# Patient Record
Sex: Male | Born: 1960 | Race: Black or African American | Hispanic: No | Smoking: Never smoker
Health system: Southern US, Community
[De-identification: ages and names within clinical notes are randomized; demographics above are authoritative.]

## PROBLEM LIST (undated history)

## (undated) DIAGNOSIS — F419 Anxiety disorder, unspecified: Secondary | ICD-10-CM

## (undated) DIAGNOSIS — I10 Essential (primary) hypertension: Secondary | ICD-10-CM

## (undated) HISTORY — PX: CHOLECYSTECTOMY: SHX55

## (undated) HISTORY — DX: Anxiety disorder, unspecified: F41.9

---

## 2003-05-17 ENCOUNTER — Ambulatory Visit (HOSPITAL_COMMUNITY): Admission: RE | Admit: 2003-05-17 | Discharge: 2003-05-17 | Payer: Self-pay | Admitting: Gastroenterology

## 2003-05-24 ENCOUNTER — Inpatient Hospital Stay (HOSPITAL_COMMUNITY): Admission: AD | Admit: 2003-05-24 | Discharge: 2003-06-08 | Payer: Self-pay | Admitting: Internal Medicine

## 2003-05-24 ENCOUNTER — Encounter: Admission: RE | Admit: 2003-05-24 | Discharge: 2003-05-24 | Payer: Self-pay | Admitting: Family Medicine

## 2003-05-28 ENCOUNTER — Encounter (INDEPENDENT_AMBULATORY_CARE_PROVIDER_SITE_OTHER): Payer: Self-pay | Admitting: Specialist

## 2009-01-07 ENCOUNTER — Emergency Department (HOSPITAL_COMMUNITY): Admission: EM | Admit: 2009-01-07 | Discharge: 2009-01-08 | Payer: Self-pay | Admitting: Emergency Medicine

## 2009-03-01 ENCOUNTER — Other Ambulatory Visit: Payer: Self-pay | Admitting: Emergency Medicine

## 2009-03-02 ENCOUNTER — Inpatient Hospital Stay (HOSPITAL_COMMUNITY): Admission: EM | Admit: 2009-03-02 | Discharge: 2009-03-06 | Payer: Self-pay | Admitting: Internal Medicine

## 2009-03-02 ENCOUNTER — Other Ambulatory Visit: Payer: Self-pay | Admitting: Emergency Medicine

## 2009-03-05 LAB — CONVERTED CEMR LAB
Calcium: 9.6 mg/dL
Potassium: 4 meq/L

## 2009-03-21 ENCOUNTER — Encounter (INDEPENDENT_AMBULATORY_CARE_PROVIDER_SITE_OTHER): Payer: Self-pay | Admitting: Nurse Practitioner

## 2009-03-21 ENCOUNTER — Ambulatory Visit: Payer: Self-pay | Admitting: Internal Medicine

## 2009-03-21 DIAGNOSIS — F1021 Alcohol dependence, in remission: Secondary | ICD-10-CM

## 2009-03-21 DIAGNOSIS — I10 Essential (primary) hypertension: Secondary | ICD-10-CM

## 2009-03-21 DIAGNOSIS — F411 Generalized anxiety disorder: Secondary | ICD-10-CM | POA: Insufficient documentation

## 2009-05-02 ENCOUNTER — Ambulatory Visit: Payer: Self-pay | Admitting: Nurse Practitioner

## 2009-05-06 ENCOUNTER — Telehealth (INDEPENDENT_AMBULATORY_CARE_PROVIDER_SITE_OTHER): Payer: Self-pay | Admitting: Nurse Practitioner

## 2009-05-08 ENCOUNTER — Telehealth (INDEPENDENT_AMBULATORY_CARE_PROVIDER_SITE_OTHER): Payer: Self-pay | Admitting: Nurse Practitioner

## 2009-05-22 ENCOUNTER — Encounter (INDEPENDENT_AMBULATORY_CARE_PROVIDER_SITE_OTHER): Payer: Self-pay | Admitting: *Deleted

## 2009-07-02 ENCOUNTER — Ambulatory Visit: Payer: Self-pay | Admitting: Nurse Practitioner

## 2009-12-19 ENCOUNTER — Ambulatory Visit: Payer: Self-pay | Admitting: Nurse Practitioner

## 2010-02-01 LAB — CONVERTED CEMR LAB
Hemoglobin: 13 g/dL (ref 13.0–17.0)
MCHC: 33.5 g/dL (ref 30.0–36.0)
MCV: 82.9 fL (ref 78.0–100.0)
Platelets: 200 10*3/uL (ref 150–400)
RBC: 4.68 M/uL (ref 4.22–5.81)
RDW: 12.5 % (ref 11.5–15.5)
Retic Ct Pct: 1.2 % (ref 0.4–3.1)
WBC: 6.3 10*3/uL (ref 4.0–10.5)

## 2010-02-03 NOTE — Assessment & Plan Note (Signed)
Summary: NEW - Establish Care   Vital Signs:  Patient profile:   50 year old male Height:      65 inches Weight:      151.6 pounds BMI:     25.32 BSA:     1.76 Temp:     98.0 degrees F oral Pulse rate:   76 / minute Pulse rhythm:   regular Resp:     20 per minute BP sitting:   141 / 79  (left arm) Cuff size:   large  Vitals Entered By: Levon Hedger (March 21, 2009 9:55 AM) CC: follow-up visit [facility], Hypertension Management Is Patient Diabetic? No Pain Assessment Patient in pain? no       Does patient need assistance? Functional Status Self care Ambulation Normal   CC:  follow-up visit [facility] and Hypertension Management.  History of Present Illness:  Pt is here to establish care with this office Pt was previously seen by Dr. Elmer Picker. Last visit there was 2 months ago.  He was being seen there for htn.  PMH - HTN PSH - cholecystectomy 4 years ago  Pt was admitted to the hospital on 03/02/2009 and stayed for 3.5 days. Pt admitted that he had a history of alcohol use prior to the admission and tried to stop drinking at home. Drinking was interfering with his life and his family was making comments on pt's drinking. DX: ETOH withdrawl, ETOH intoxication, mild hypokalemia, mild hyponatremia. He was started on ativan protocal.  At discharge he was better and nontremulous.   He was set up for [facility] program on March 17th and was seen there however he was not aware that it was a 30 day rehab program.   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   He was not prepared for such committment on yesterday.  He would still like to do a day treatment program of some sort. Pt has been doing well since his hospitalization.  He has not taken any ativan as needed   Anxiety - Long history of anxiety.  Pt has alprazolam that he takes very infrequently (last prescription given in the hospital).  He is taking the celexa as ordered. Pt is tolerating well.    Hypertension History:    He denies headache, chest pain, and palpitations.  He notes no problems with any antihypertensive medication side effects.  Pt does not have a blood pressure machine at home.  .  Further comments include: He is taking medication as per discharge recommendations.        Positive major cardiovascular risk factors include male age 63 years old or older and hypertension.  Negative major cardiovascular risk factors include non-tobacco-user status.     Habits & Providers  Alcohol-Tobacco-Diet     Alcohol drinks/day: 0     Alcohol Counseling: to STOP drinking     Alcohol type: beer     >5/day in last 3 mos: yes     Tobacco Status: never  Exercise-Depression-Behavior     Drug Use: never  Comments: Quit drinking 3 weeks ago ETOH - peaked at 12 beers at night.  Pt then decided to quit and had some withdrawl.  Allergies (verified): No Known Drug Allergies  Past History:  Past Surgical History: Cholecystectomy 2006  Family History: mother - diabetes, breast cancer (deceased) sister - diabetes father - htn  Social History: 0 children No tobacco ETOH - quit 02/2009 Drug use - noneSmoking Status:  never Drug Use:  never  Review of Systems General:  Denies fever. CV:  Denies chest pain or discomfort. Resp:  Denies cough. GI:  Denies abdominal pain, nausea, and vomiting. Neuro:  Denies headaches. Psych:  Denies anxiety and depression.  Physical Exam  General:  alert.   Head:  normocephalic.   Lungs:  normal breath sounds.   Heart:  normal rate and regular rhythm.   Msk:  up to the exam table Neurologic:  alert & oriented X3 and gait normal.   Skin:  color normal.   Psych:  Oriented X3.     Impression & Recommendations:  Problem # 1:  HYPERTENSION, BENIGN ESSENTIAL (ICD-401.1) DASH diet continue current medications His updated medication list for this problem includes:    Metoprolol Tartrate 100 Mg Tabs (Metoprolol tartrate) ..... One tablet by mouth twice daily     Hydrochlorothiazide 25 Mg Tabs (Hydrochlorothiazide) .Marland Kitchen... Take one (1) by mouth daily  Problem # 2:  ANXIETY DISORDER (ICD-300.00) Continue current medications His updated medication list for this problem includes:    Citalopram Hydrobromide 10 Mg Tabs (Citalopram hydrobromide) ..... One tablet by mouth daily    Lorazepam 1 Mg Tabs (Lorazepam) ..... One tablet by mouth daily as needed for anxiety  Problem # 3:  ALCOHOL ABUSE, HX OF (ICD-V11.3) doing great advised pt to see if he can get into a day treatment program.  Complete Medication List: 1)  Folic Acid 1 Mg Tabs (Folic acid) .... One tablet by mouth daily 2)  Vitamin B-1 100 Mg Tabs (Thiamine hcl) .... One tablet by mouth daily 3)  Multivitamins Caps (Multiple vitamin) .... One capsule by mouth daily 4)  Metoprolol Tartrate 100 Mg Tabs (Metoprolol tartrate) .... One tablet by mouth twice daily 5)  Citalopram Hydrobromide 10 Mg Tabs (Citalopram hydrobromide) .... One tablet by mouth daily 6)  Hydrochlorothiazide 25 Mg Tabs (Hydrochlorothiazide) .... Take one (1) by mouth daily 7)  Lorazepam 1 Mg Tabs (Lorazepam) .... One tablet by mouth daily as needed for anxiety  Hypertension Assessment/Plan:      The patient's hypertensive risk group is category B: At least one risk factor (excluding diabetes) with no target organ damage.  Today's blood pressure is 141/79.  His blood pressure goal is < 140/90.  Patient Instructions: 1)  Check with [facility] and see if you can get into a day treatment program. 2)  Blood pressure - check twice per week at home.  Read handout.  Decrease salty and processed foods. 3)  Continue current medications 4)  Keep your appointment for eligibility on April 27th. 5)  Follow up in this office in 6 weeks for high blood pressure. 6)  Will need EKG, anemia panel.

## 2010-02-03 NOTE — Letter (Signed)
Summary: Generic Letter  [facility]  [address] [place], [place] [address]   Phone: [phone]  Fax: [phone]    05/22/2009  Miguel Washington [address] [place], [place]  [address]  Dear [name],  We have been unable to reach you by phone. We have a prescriptoin for you. Please call our office to let us know where to send it.         Sincerely,   Gaylyn Cheers RN  Appended Document: Celexa refill Pt. returned call notified of refill for Celexa, refilled called to [facility] Orlando Penner RN  June 05, 2009 4:31 PM    Clinical Lists Changes

## 2010-02-03 NOTE — Progress Notes (Signed)
Summary: Lab results  Phone Note Outgoing Call   Summary of Call: see if pt has remained alcholol free? if so, congrats to pt. If so, ok to stop folic acid and vitamin b 12 he will need to continue just the multivitamin daily as he is still just a little anemic but improved from hospitalization Initial call taken by: Lehman Prom FNP,  May 06, 2009 4:09 PM  Follow-up for Phone Call        Levon Hedger  May 06, 2009 4:59 PM Left message on machine for pt to return call to the office.  pt informed of above information. Follow-up by: Levon Hedger,  May 08, 2009 4:19 PM

## 2010-02-03 NOTE — Assessment & Plan Note (Signed)
Summary: HTN   Vital Signs:  Patient profile:   50 year old male Weight:      159.2 pounds BMI:     26.59 Temp:     98.0 degrees F oral Pulse rate:   71 / minute Pulse rhythm:   regular Resp:     16 per minute BP sitting:   174 / 100  (left arm) Cuff size:   regular  Vitals Entered ByLevon Hedger (July 02, 2009 2:19 PM)  Serial Vital Signs/Assessments:  Time      Position  BP       Pulse  Resp  Temp     By                     159/90                         Levon Hedger  CC: follow-up visit 2 month, Hypertension Management Is Patient Diabetic? No Pain Assessment Patient in pain? no       Does patient need assistance? Functional Status Self care Ambulation Normal Comments did not bring Lorazepam medication bottle but states that he is taking it.   CC:  follow-up visit 2 month and Hypertension Management.  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  Temptation is strong.  Hypertension History:      He denies headache, chest pain, and palpitations.  He notes no problems with any antihypertensive medication side effects.  Pt has taken blood pressure medications already today. He also checks at home and it is doing well.        Positive major cardiovascular risk factors include male age 76 years old or older and hypertension.  Negative major cardiovascular risk factors include non-tobacco-user status.     Habits & Providers  Alcohol-Tobacco-Diet     Alcohol drinks/day: 1     Alcohol Counseling: to STOP drinking     Alcohol type: beer     >5/day in last 3 mos: yes     Tobacco Status: never  Exercise-Depression-Behavior     Drug Use: never  Comments: recently restarted ETOH  use  Medications Prior to Update: 1)  Multivitamins  Caps (Multiple Vitamin) .... One Capsule By Mouth Daily 2)  Metoprolol Tartrate 100 Mg Tabs (Metoprolol Tartrate) .... One Tablet By Mouth Twice Daily 3)  Celexa 10 Mg Tabs (Citalopram Hydrobromide) .... One Tablet By Mouth Daily For Mood 4)  Hydrochlorothiazide 25 Mg Tabs (Hydrochlorothiazide) .... Take One (1) By Mouth Daily 5)  Lorazepam 1 Mg Tabs (Lorazepam) .... One Tablet By Mouth Daily As Needed For Anxiety  Allergies (verified): No Known Drug Allergies  Review of Systems General:  Denies fever. CV:  Denies chest pain or discomfort. Resp:  Denies cough. GI:  Denies abdominal pain, nausea, and vomiting. Psych:  Complains of anxiety; improved some with celexa 10mg .  Physical Exam  General:  alert.   Head:  normocephalic.   Lungs:  normal breath sounds.   Heart:  normal rate and regular rhythm.   Msk:  up to the exam table Neurologic:  alert & oriented X3.  Skin:  color normal.   Psych:  anxious appearing   Impression & Recommendations:  Problem # 1:  HYPERTENSION, BENIGN ESSENTIAL (ICD-401.1) BP is elevated today but ok during last check will continue current meds reinforced DASH diet His updated medication list for this problem includes:    Metoprolol Tartrate 100 Mg Tabs (Metoprolol tartrate) ..... One tablet by mouth twice daily    Hydrochlorothiazide 25 Mg Tabs (Hydrochlorothiazide) .Marland Kitchen... Take one (1) by mouth daily  Problem # 2:  ANXIETY DISORDER (ICD-300.00) advised pt that he is taking a low dose of celexa - will increse if necessary The following medications were removed from the medication list:    Lorazepam 1 Mg Tabs (Lorazepam) ..... One tablet by mouth daily as needed for anxiety His updated medication list for this problem includes:    Celexa 10 Mg Tabs (Citalopram hydrobromide) ..... One tablet by mouth daily for mood  Complete Medication List: 1)  Multivitamins Caps (Multiple vitamin) .... One  capsule by mouth daily 2)  Metoprolol Tartrate 100 Mg Tabs (Metoprolol tartrate) .... One tablet by mouth twice daily 3)  Celexa 10 Mg Tabs (Citalopram hydrobromide) .... One tablet by mouth daily for mood 4)  Hydrochlorothiazide 25 Mg Tabs (Hydrochlorothiazide) .... Take one (1) by mouth daily  Hypertension Assessment/Plan:      The patient's hypertensive risk group is category B: At least one risk factor (excluding diabetes) with no target organ damage.  Today's blood pressure is 174/100.  His blood pressure goal is < 140/90.  Patient Instructions: 1)  Schedule an appointment for eligibility if your card has expired.  2)  Be sure to  take all the necessary paperwork with you. 3)  Do your best to limit you alcohol intake 4)  Blood pressure is elevated today. Be sure to take your medications daily. 5)  Follow up in 3 months for blood pressure

## 2010-02-03 NOTE — Progress Notes (Signed)
Summary: ? ABOUT VITAMINS/DEPRESSION MED  Phone Note Call from Patient Call back at Home Phone [phone]   Reason for Call: Talk to Nurse Summary of Call: Miguel PT. MR Washington CALLED BACK AND SAYS THAT SINCE HE IS TO KEEP TAKING THE VITAMINS, HE DOES NOT HAVE ANYMORE, AND THEY WERE PRESCRIBED BY THE HOSPITAL AND HE WAS TAKING CITALORPRAM FOR DEPRESSION , IT WAS HELPING HIM, BUT HE WANTS TO KNOW DOES HE CONTINU TAKING THESE MEDS. Initial call taken by: Leodis Rains,  May 08, 2009 4:50 PM  Follow-up for Phone Call        FORWARD TO Jesse Fall, FNP Follow-up by: Levon Hedger,  May 09, 2009 9:51 AM  Additional Follow-up for Phone Call Additional follow up Details #1::        pt was instructed to stop the folic acid and vitamin B so he does not need these.  He can continue the multivitamin which can be purchased over the counter.  Rx for celexa 10mg  by mouth in basket - fax to pt's pharmacy Additional Follow-up by: Lehman Prom FNP,  May 09, 2009 10:35 AM    Additional Follow-up for Phone Call Additional follow up Details #2::    Levon Hedger  May 12, 2009 5:13 PM left message on machine for pt to return call to the office.  Levon Hedger  May 16, 2009 5:04 PM Left message on the machine for pt to return call to the office.  left message on answer machine for pt. to return call Gaylyn Cheers RN  May 22, 2009 3:06 PM   ok to mail pt a letter since it has been 3 attempts n.[email or web address],[email or web address]  May 22, 2009 5:20 PM   Additional Follow-up for Phone Call Additional follow up Details #3:: Details for Additional Follow-up Action Taken: Letter mailed Gaylyn Cheers RN  May 22, 2009 5:41 PM   New/Updated Medications: CELEXA 10 MG TABS (CITALOPRAM HYDROBROMIDE) One tablet by mouth daily for mood zPrescriptions: CELEXA 10 MG TABS (CITALOPRAM HYDROBROMIDE) One tablet by mouth daily for mood  #30 x 5   Entered and Authorized by:   Lehman Prom FNP   Signed by:   Lehman Prom FNP on 05/09/2009   Method used:   Printed then faxed to ...         RxID:   [id number]

## 2010-02-03 NOTE — Letter (Signed)
Summary: PT INFORMATION SHEET  PT INFORMATION SHEET   Imported By: Arta Bruce 05/20/2009 11:10:42  _____________________________________________________________________  External Attachment:    Type:   Image     Comment:   External Document

## 2010-02-26 ENCOUNTER — Telehealth (INDEPENDENT_AMBULATORY_CARE_PROVIDER_SITE_OTHER): Payer: Self-pay | Admitting: Nurse Practitioner

## 2010-03-12 NOTE — Progress Notes (Signed)
Summary: BP MEDS NEEDS REFILL  Phone Note Call from Patient Call back at Home Phone [phone]   Reason for Call: Refill Medication Summary of Call: MARTIN PT. MR Vellucci SAYS THAT HE IS NEEDING REFILLS ON HIS HCTZ AND THE METOPROLOL TO BE CALLED INTO [facility] ON [address]. Initial call taken by: Leodis Rains,  February 26, 2010 4:31 PM  Follow-up for Phone Call        Refills completed per protocol per Jesse Fall -- last seen 08/2009.  Must see provider before further refills.  Left message on voicemail for pt. to return call.  Dutch Quint RN  February 27, 2010 5:08 PM  Left message on answering machine for pt. to return call.  Dutch Quint RN  March 02, 2010 3:48 PM  Left message on answer machine for pt. to return call. Gaylyn Cheers RN  March 03, 2010 12:33 PM  Please call pt.  Sharen Heck RN,  March 04, 2010 8:36 AM  Left message on answering machine for pt. to return call.  Dutch Quint RN  March 05, 2010 9:10 AM  Left message on answering machine for pt. to return call.  Dutch Quint RN  March 05, 2010 3:56 PM  Pt. advised of refills -- has f/u appt. 03/10/10.  Dutch Quint RN  March 05, 2010 4:17 PM     Prescriptions: HYDROCHLOROTHIAZIDE 25 MG TABS (HYDROCHLOROTHIAZIDE) Take one (1) by mouth daily  #30 x 3   Entered by:   Dutch Quint RN   Authorized by:   Lehman Prom FNP   Signed by:   Dutch Quint RN on 02/27/2010   Method used:   Electronically to        [facility]       [address]       [place], [place]  [address]       Ph: [phone]       Fax: [phone]   RxID:   [id number] METOPROLOL TARTRATE 100 MG TABS (METOPROLOL TARTRATE) One tablet by mouth twice daily  #60 x 3   Entered by:   Dutch Quint RN   Authorized by:   Lehman Prom FNP   Signed by:   Dutch Quint RN on 02/27/2010   Method used:   Electronically to        [facility]       [address]       [place], [place]  [address]       Ph: [phone]       Fax: [phone]   RxID:   [id number]

## 2010-03-19 ENCOUNTER — Encounter: Payer: Self-pay | Admitting: Nurse Practitioner

## 2010-03-19 ENCOUNTER — Encounter (INDEPENDENT_AMBULATORY_CARE_PROVIDER_SITE_OTHER): Payer: Self-pay | Admitting: Nurse Practitioner

## 2010-03-19 LAB — CONVERTED CEMR LAB
Bilirubin Urine: NEGATIVE
Blood in Urine, dipstick: NEGATIVE
Specific Gravity, Urine: 1.015

## 2010-03-20 LAB — CONVERTED CEMR LAB
ALT: 38 units/L (ref 0–53)
AST: 30 units/L (ref 0–37)
Alkaline Phosphatase: 59 units/L (ref 39–117)
BUN: 8 mg/dL (ref 6–23)
Basophils Absolute: 0.1 10*3/uL (ref 0.0–0.1)
CO2: 33 meq/L — ABNORMAL HIGH (ref 19–32)
Calcium: 10.1 mg/dL (ref 8.4–10.5)
Eosinophils Absolute: 0.1 10*3/uL (ref 0.0–0.7)
Glucose, Bld: 97 mg/dL (ref 70–99)
HCT: 37.8 % — ABNORMAL LOW (ref 39.0–52.0)
Lymphocytes Relative: 22 % (ref 12–46)
Lymphs Abs: 1.4 10*3/uL (ref 0.7–4.0)
MCV: 87.5 fL (ref 78.0–100.0)
Monocytes Relative: 26 % — ABNORMAL HIGH (ref 3–12)
PSA: 0.37 ng/mL (ref <=4.00)
Platelets: 215 10*3/uL (ref 150–400)
RBC: 4.32 M/uL (ref 4.22–5.81)
Total Protein: 7.5 g/dL (ref 6.0–8.3)

## 2010-03-24 NOTE — Assessment & Plan Note (Signed)
Summary: HTN   Vital Signs:  Patient profile:   50 year old male Weight:      162.19 pounds Temp:     98.2 degrees F oral Pulse rate:   67 / minute Pulse rhythm:   regular Resp:     18 per minute BP sitting:   150 / 82  (left arm) Cuff size:   regular  Vitals Entered By: Hale Drone CMA (March 19, 2010 3:20 PM) CC: BP check , Hypertension Management Is Patient Diabetic? No Pain Assessment Patient in pain? no       Does patient need assistance? Functional Status Self care Ambulation Normal   CC:  BP check  and Hypertension Management.  History of Present Illness:  Pt into the office for follow up. Pt presents today with his medications.   Hypertension History:      He denies headache, chest pain, and palpitations.        Positive major cardiovascular risk factors include male age 100 years old or older and hypertension.  Negative major cardiovascular risk factors include no history of diabetes or hyperlipidemia and non-tobacco-user status.        Further assessment for target organ damage reveals no history of ASHD, cardiac end-organ damage (CHF/LVH), stroke/TIA, peripheral vascular disease, renal insufficiency, or hypertensive retinopathy.     Habits & Providers  Alcohol-Tobacco-Diet     Alcohol drinks/day: 1     Alcohol Counseling: to STOP drinking     Alcohol type: beer     >5/day in last 3 mos: yes     Tobacco Status: never  Exercise-Depression-Behavior     Have you felt down or hopeless? no     Have you felt little pleasure in things? no     Depression Counseling: not indicated; screening negative for depression     Drug Use: never  Current Medications (verified): 1)  Multivitamins  Caps (Multiple Vitamin) .... One Capsule By Mouth Daily 2)  Metoprolol Tartrate 100 Mg Tabs (Metoprolol Tartrate) .... One Tablet By Mouth Twice Daily 3)  Celexa 10 Mg Tabs (Citalopram Hydrobromide) .... One Tablet By Mouth Daily For Mood 4)  Hydrochlorothiazide 25 Mg Tabs  (Hydrochlorothiazide) .... Take One (1) By Mouth Daily  Allergies (verified): No Known Drug Allergies  Review of Systems General:  Denies fever. CV:  Denies chest pain or discomfort. Resp:  Denies cough. GI:  Denies abdominal pain, nausea, and vomiting. MS:  Denies joint pain. Psych:  Complains of anxiety; pt has recently stopped drinking again - 1 week stopped and he has lorazepam that expired on 02/27/2010. He is requesting a refill.  Physical Exam  General:  alert.   Head:  normocephalic.   Lungs:  normal breath sounds.   Heart:  normal rate and regular rhythm.   Abdomen:  normal bowel sounds.   Msk:  normal ROM.   Neurologic:  alert & oriented X3.   Skin:  color normal.   Psych:  Oriented X3.     Impression & Recommendations:  Problem # 1:  HYPERTENSION, BENIGN ESSENTIAL (ICD-401.1)  His updated medication list for this problem includes:    Metoprolol Tartrate 100 Mg Tabs (Metoprolol tartrate) ..... One tablet by mouth twice daily    Hydrochlorothiazide 25 Mg Tabs (Hydrochlorothiazide) .Marland Kitchen... Take one (1) by mouth daily  Orders: EKG w/ Interpretation (93000) T-Urine Microalbumin w/creat. ratio [id number]) T-Comprehensive Metabolic Panel [id number]) T-PSA [id number]) T-CBC w/Diff [id number]) Rapid HIV  (92370) T-Syphilis Test (RPR) (32440-10272) T-TSH (53664-40347)  Problem #  2:  ANXIETY DISORDER (ICD-300.00)  The following medications were removed from the medication list:    Celexa 10 Mg Tabs (Citalopram hydrobromide) ..... One tablet by mouth daily for mood His updated medication list for this problem includes:    Lorazepam 0.5 Mg Tabs (Lorazepam) ..... One tablet by mouth daily as needed for nerves  Problem # 3:  ALCOHOL ABUSE, HX OF (ICD-V11.3) pt has quit x 1 week ago.  Complete Medication List: 1)  Multivitamins Caps (Multiple vitamin) .... One capsule by mouth daily 2)  Metoprolol Tartrate 100 Mg Tabs (Metoprolol tartrate) .... One tablet  by mouth twice daily 3)  Hydrochlorothiazide 25 Mg Tabs (Hydrochlorothiazide) .... Take one (1) by mouth daily 4)  Lorazepam 0.5 Mg Tabs (Lorazepam) .... One tablet by mouth daily as needed for nerves  Other Orders: UA Dipstick w/o Micro (automated)  (81003)  Hypertension Assessment/Plan:      The patient's hypertensive risk group is category B: At least one risk factor (excluding diabetes) with no target organ damage.  Today's blood pressure is 150/82.  His blood pressure goal is < 140/90.  Patient Instructions: 1)  Blood pressure - Keep taking your blood pressure medication. 2)  Get the alprazolam refilled because the meds your have are expired 3)  Make an appointment for fasting labs - lipids. 4)  All other labs will be checked today and you will be notified of the results. 5)  Follow up in 6 months for high blood pressure. Prescriptions: LORAZEPAM 0.5 MG TABS (LORAZEPAM) One tablet by mouth daily as needed for nerves  #30 x 0   Entered and Authorized by:   Lehman Prom FNP   Signed by:   Lehman Prom FNP on 03/19/2010   Method used:   Print then Give to Patient   RxID:   [id number]    Orders Added: 1)  UA Dipstick w/o Micro (automated)  [81003] 2)  Est. Patient Level III [95621] 3)  EKG w/ Interpretation [93000] 4)  T-Urine Microalbumin w/creat. ratio [[id number]] 5)  T-Comprehensive Metabolic Panel [80053-22900] 6)  T-PSA [30865-78469] 7)  T-CBC w/Diff [62952-84132] 8)  Rapid HIV  [92370] 9)  T-Syphilis Test (RPR) [44010-27253] 10)  T-TSH [66440-34742]    Laboratory Results   Urine Tests  Date/Time Received: March 19, 2010 3:25 PM   Routine Urinalysis   Color: lt. yellow Glucose: negative   (Normal Range: Negative) Bilirubin: negative   (Normal Range: Negative) Ketone: negative   (Normal Range: Negative) Spec. Gravity: 1.015   (Normal Range: 1.003-1.035) Blood: negative   (Normal Range: Negative) pH: 7.5   (Normal Range: 5.0-8.0) Protein:  negative   (Normal Range: Negative) Urobilinogen: 1.0   (Normal Range: 0-1) Nitrite: negative   (Normal Range: Negative) Leukocyte Esterace: negative   (Normal Range: Negative)        EKG  Procedure date:  03/19/2010  Findings:      Sinus rhythm   Appended Document: HTN     Allergies: No Known Drug Allergies   Complete Medication List: 1)  Multivitamins Caps (Multiple vitamin) .... One capsule by mouth daily 2)  Metoprolol Tartrate 100 Mg Tabs (Metoprolol tartrate) .... One tablet by mouth twice daily 3)  Hydrochlorothiazide 25 Mg Tabs (Hydrochlorothiazide) .... Take one (1) by mouth daily 4)  Lorazepam 0.5 Mg Tabs (Lorazepam) .... One tablet by mouth daily as needed for nerves      Laboratory Results  Date/Time Received: March 19, 2010 5:14 PM   Other Tests  Rapid HIV:  negative

## 2010-03-25 LAB — CBC
HCT: 42.8 % (ref 39.0–52.0)
Hemoglobin: 14.5 g/dL (ref 13.0–17.0)
MCV: 90.6 fL (ref 78.0–100.0)
RDW: 13.2 % (ref 11.5–15.5)

## 2010-03-25 LAB — DIFFERENTIAL
Basophils Absolute: 0 10*3/uL (ref 0.0–0.1)
Eosinophils Absolute: 0 10*3/uL (ref 0.0–0.7)
Lymphocytes Relative: 15 % (ref 12–46)
Lymphs Abs: 1.1 10*3/uL (ref 0.7–4.0)
Monocytes Absolute: 1.2 10*3/uL — ABNORMAL HIGH (ref 0.1–1.0)
Monocytes Relative: 17 % — ABNORMAL HIGH (ref 3–12)

## 2010-03-25 LAB — COMPREHENSIVE METABOLIC PANEL
ALT: 55 U/L — ABNORMAL HIGH (ref 0–53)
Albumin: 4.5 g/dL (ref 3.5–5.2)
Alkaline Phosphatase: 80 U/L (ref 39–117)
CO2: 23 mEq/L (ref 19–32)
Chloride: 82 mEq/L — ABNORMAL LOW (ref 96–112)
Potassium: 3.3 mEq/L — ABNORMAL LOW (ref 3.5–5.1)
Sodium: 130 mEq/L — ABNORMAL LOW (ref 135–145)
Total Protein: 9.3 g/dL — ABNORMAL HIGH (ref 6.0–8.3)

## 2010-03-25 LAB — URINALYSIS, ROUTINE W REFLEX MICROSCOPIC
Protein, ur: NEGATIVE mg/dL
Urobilinogen, UA: 1 mg/dL (ref 0.0–1.0)
pH: 6 (ref 5.0–8.0)

## 2010-03-25 LAB — LIPASE, BLOOD: Lipase: 37 U/L (ref 11–59)

## 2010-03-26 LAB — BASIC METABOLIC PANEL
BUN: 14 mg/dL (ref 6–23)
BUN: 6 mg/dL (ref 6–23)
BUN: 9 mg/dL (ref 6–23)
Chloride: 92 mEq/L — ABNORMAL LOW (ref 96–112)
GFR calc Af Amer: >60 mL/min (ref >60)
GFR calc non Af Amer: >60 mL/min (ref >60)
Glucose, Bld: 109 mg/dL — ABNORMAL HIGH (ref 70–99)
Potassium: 2.5 mEq/L — CL (ref 3.5–5.1)
Potassium: 3.1 mEq/L — ABNORMAL LOW (ref 3.5–5.1)
Potassium: 3.3 mEq/L — ABNORMAL LOW (ref 3.5–5.1)
Sodium: 132 mEq/L — ABNORMAL LOW (ref 135–145)
Sodium: 133 mEq/L — ABNORMAL LOW (ref 135–145)
Sodium: 135 mEq/L (ref 135–145)

## 2010-03-26 LAB — CBC
MCHC: 34.5 g/dL (ref 30.0–36.0)
MCV: 89.2 fL (ref 78.0–100.0)
RBC: 4.01 MIL/uL — ABNORMAL LOW (ref 4.22–5.81)
RDW: 13.5 % (ref 11.5–15.5)
WBC: 6.3 10*3/uL (ref 4.0–10.5)

## 2010-03-26 LAB — MAGNESIUM: Magnesium: 1.8 mg/dL (ref 1.5–2.5)

## 2010-03-30 LAB — BASIC METABOLIC PANEL
BUN: 7 mg/dL (ref 6–23)
CO2: 29 mEq/L (ref 19–32)
CO2: 31 mEq/L (ref 19–32)
Calcium: 9.4 mg/dL (ref 8.4–10.5)
Calcium: 9.6 mg/dL (ref 8.4–10.5)
Chloride: 100 mEq/L (ref 96–112)
Creatinine, Ser: 0.9 mg/dL (ref 0.4–1.5)
Creatinine, Ser: 0.91 mg/dL (ref 0.4–1.5)
Creatinine, Ser: 0.99 mg/dL (ref 0.4–1.5)
GFR calc Af Amer: >60 mL/min (ref >60)
GFR calc Af Amer: >60 mL/min (ref >60)
GFR calc non Af Amer: >60 mL/min (ref >60)
GFR calc non Af Amer: >60 mL/min (ref >60)
Glucose, Bld: 100 mg/dL — ABNORMAL HIGH (ref 70–99)
Glucose, Bld: 92 mg/dL (ref 70–99)
Potassium: 3.8 mEq/L (ref 3.5–5.1)
Sodium: 136 mEq/L (ref 135–145)

## 2010-03-30 LAB — PHOSPHORUS: Phosphorus: 4.7 mg/dL — ABNORMAL HIGH (ref 2.3–4.6)

## 2010-05-22 NOTE — H&P (Signed)
NAMEJERSON, FURUKAWA                     ACCOUNT NO.:  [id number]   MEDICAL RECORD NO.:  [id number]                   PATIENT TYPE:  INP   LOCATION:  5030                                 FACILITY:  [facility]   PHYSICIAN:  Hollice Espy, M.D.            DATE OF BIRTH:  06-06-1960   DATE OF ADMISSION:  05/24/2003  DATE OF DISCHARGE:                                HISTORY & PHYSICAL   CHIEF COMPLAINT:  Liver mass.   HISTORY OF PRESENT ILLNESS:  This is a 50 year old African-American male  with past medical history of hypertension and heavy alcohol abuse who  presents with the findings of a liver mass. The patient previously has been  drinking for approximately 20 years, at least 15 to 20 beers a day. He  otherwise, had no medical complaints and was taking his hypertensive  medications regularly. Approximately 3 weeks ago, he started having some  abdominal discomfort and severe nausea and vomiting. Prior to that, he noted  no significant weight loss but then found that he was unable to keep  anything down. He was tried on a course of Aciphex, which did not seem to  make any difference. He had an endoscopy and colonoscopy done on May 17, 2003 by Dr. Tasia Catchings of [facility]. At that time, his EGD only noted a  mild distal non-hemorrhagic esophagitis. His colonoscopy showed no tumors,  polyps, or neoplastic lesions. No evidence of bleeding. The patient  continued to have increasing abdominal pain and outpatient lab work showed  an elevated bilirubin. His problems persisted and labs were rechecked. At  that time, his liver function studies were reportedly normal. Labs were  rechecked again today and the patient was found to have significantly  elevated liver function studies along with increased total bilirubin of 14.  The patient was sent in for an ultrasound of the abdomen, which was done on  May 24, 2003, which noted a 14.8 x 7.3 x 10.2 cm irregular inhomogeneous  mass in the  central portion of the liver, obstructing the bile ducts in the  right and left lobes. The gallbladder was noted to be contracting with a  thickened mechanic wall. The common bile duct was not visible. Kidneys were  unremarkable and some multiple hyperechoic foci were seen within the spleen.  A tiny amount of ascites were noted as well. This was suspicious for primary  hepatocellular carcinoma. Given these findings and the patient's lab  studies, the patient was made a direct admission for further evaluation.  Currently, the patient states that he is feeling okay. He is a little bit  anxious about the possibility that he may have cancer but otherwise, he  denies any complaints and currently is not having any nausea although he  does feel weak from not eating. He denies any chest pain or shortness of  breath. He denies any palpitation. He denies any headache, visual changes or  dysphagia. He denies  any current abdominal pain. He denies any hematuria or  dysuria. He has not had a bowel movement for quite some time but he does not  think it is from constipation. He thinks it is from not being able to move  his bowels. He has no problem with flatus. He denies any diarrhea. He denies  any focal extremity weakness or numbness. He does state that overall, he  does feel fatigued. He does tell me that his last drink was 3 weeks ago when  he was last able to take decreased p.o. and he has not had an alcoholic  beverage since then.   PAST MEDICAL HISTORY:  Hypertension times at least 20 years. He also is  positive for heavy alcohol abuse.   MEDICATIONS:  The patient takes Norvasc 5 daily, Metoprolol 100 mg in the  a.m. and 50 mg at night. Hydrochlorothiazide 12.5 mg p.o. daily. And he also  takes a multivitamin. He has recently been on a course of Aciphex 1 p.o.  daily for approximately a week.   ALLERGIES:  No known drug allergies.   SOCIAL HISTORY:  He denies any tobacco or drug use. He is,  however, a heavy  beer drinker of 15 to 20 beers daily for the last 20 years.   FAMILY HISTORY:  Positive for heart disease, different types of cancer,  blood pressure and diabetes.   PHYSICAL EXAMINATION:  VITAL SIGNS:  Temperature 100.3, blood pressure  115/80, respiratory rate 20, pulse 91, O2 saturation 100% on room air.  GENERAL:  Alert and oriented x3. No apparent distress although slightly  anxious obviously given the circumstances.  HEENT:  Normocephalic, atraumatic. Sclera are icteric. He has dry mucous  membranes. He has no carotid bruits.  HEART:  Regular rate and rhythm. There is a questionable systolic ejection  murmur, 2 out of 6 but again, it is very mild.  LUNGS:  Clear to auscultation bilaterally.  ABDOMEN:  Soft, slightly distended but nontender. He has hypoactive bowel  sounds. He has approximately what appears to feel like a 2 cm liver edge  below the costal margin.  EXTREMITIES:  He has no clubbing, cyanosis, or edema. He has good 2+ pulses.   LABORATORY DATA:  Drawn and pending. This includes a coagulation, CBC, and C-  met as well amylase and lipase.   ASSESSMENT/PLAN:  1. Liver mass. This is suspicious for primary hepatocellular carcinoma,     especially given the patient's history of heavy drinking. Will await the     labs as follows. I spoke with Dr. Ewing Schlein, who is on for [facility] tonight.     Will check a CT of the abdomen and pelvis with contrast to evaluate     whether or not the suspected mass is impinging on his biliary ductal     system and he will need an ERCP or not. If not, we may be able to proceed     straight to a biopsy. In the meantime, will avoid Tylenol and treat his     with supplements. Will also give him IV fluids with Dextrose for     supportive measures.  2. History of heavy alcohol abuse. His last drink was 3 weeks ago. At this     time, we will just watch him for withdrawal signs but I believe that we    are past the 72 hour mark  period.  3. Hypertension. Continue Norvasc, Metoprolol, and hydrochlorothiazide.  Hollice Espy, M.D.    SKK/MEDQ  D:  05/24/2003  T:  05/25/2003  Job:  [id number]   cc:   Donia Guiles, M.D.  [address] [place]  [place] [address]  Fax: [phone]   Petra Kuba, M.D.  [address]  [place]  [place] [address]  Fax: [phone]   Tasia Catchings, M.D.  [address]  [place]  [place] [address]  Fax: [phone]

## 2010-05-22 NOTE — Discharge Summary (Signed)
NAMEKHOI, HAMBERGER                     ACCOUNT NO.:  [id number]   MEDICAL RECORD NO.:  [id number]                   PATIENT TYPE:  INP   LOCATION:  5013                                 FACILITY:  [facility]   PHYSICIAN:  Corinna L. Lendell Caprice, MD             DATE OF BIRTH:  28-Mar-1960   DATE OF ADMISSION:  05/24/2003  DATE OF DISCHARGE:  06/08/2003                                 DISCHARGE SUMMARY   DIAGNOSES:  1. Pyogenic liver abscess.  2. History of alcohol abuse.  3. Hypertension.   DISCHARGE MEDICATION:  Augmentin 875 mg p.o. b.i.d. until gone.   Drain care three to four times a day and keep dry.  Limit lifting.  Advil as  needed for pain.  No restrictions on diet.   CONDITION ON DISCHARGE:  Stable.   FOLLOW UP:  Dr. Magnus Ivan next week for drain removal.   PROCEDURES:  1. Status post percutaneous drain placement in liver abscess.  2. Status post exploratory laparotomy, cholecystectomy and abscess drainage.   CONSULTATION:  Infectious disease, Dr. Magnus Ivan.   HISTORY AND HOSPITAL COURSE:  Mr. Formby is a 50 year old white male who  presented with liver mass.  He was found to have a CT of the abdomen and  pelvis which showed mass.  He had had abdominal discomfort and nausea and  vomiting three weeks prior to admission.  He had an EGD which showed  esophagitis and a colonoscopy which was unremarkable.  He had also had an  ultrasound of the abdomen which showed a mass in the liver and was therefore  admitted to the hospital.  He had a temperature of 100.3 and otherwise  normal vital signs.  He had a slightly distended abdomen which was soft and  enlarged liver.  CAT scan was done which was more suggestive for abscess  probably from gallstones.  Infectious disease was consulted.  Antibiotics  were started empirically with Zosyn and interventional radiology placed a  percutaneous drain.  Serial CAT scans were done and surgery was consulted.  The ultrasound apparently had  also shown common bile duct obstruction.  The  patient eventually underwent exploratory laparotomy, cholecystectomy,  cholangiogram and drainage of the liver abscess as the abscess  continued to be quite large despite drain placement.  Eventually, the  patient was switched to p.o. antibiotics, diet was started and he was sent  home with a drain.   Please see H&P for further admission details.                                                Corinna L. Lendell Caprice, MD    CLS/MEDQ  D:  08/07/2003  T:  08/08/2003  Job:  [id number]   cc:   Donia Guiles, M.D.  [address] [place]  [place] [address]  Fax: [phone]   Petra Kuba, M.D.  [address]  [place]  [place] [address]  Fax: [phone]   Tasia Catchings, M.D.  [address]  [place]  [place] [address]  Fax: [phone]

## 2010-05-22 NOTE — Consult Note (Signed)
NAMEHAILE, BOSLER                     ACCOUNT NO.:  [id number]   MEDICAL RECORD NO.:  [id number]                   PATIENT TYPE:  INP   LOCATION:  5030                                 FACILITY:  [facility]   PHYSICIAN:  Abigail Miyamoto, M.D.              DATE OF BIRTH:  02-Aug-1960   DATE OF CONSULTATION:  05/27/2003  DATE OF DISCHARGE:                                   CONSULTATION   REFERRING PHYSICIAN:  Hollice Espy, M.D.   CHIEF COMPLAINT:  Cholecystitis and liver abscess.   HISTORY OF PRESENT ILLNESS:  Mr. Kehoe is a 50 year old African American  gentleman with a medical history significant for hypertension and heavy  alcohol abuse, who presented with several week history of nausea, vomiting,  right upper quadrant abdominal pain.  He was admitted by Dr. Rito Ehrlich and  had a thorough medical work-up. He has recently had an upper and lower  endoscopy which was fairly unremarkable except for some mild nonhemorrhagic  esophagitis.  He had persistent elevated bilirubin and has had an ultrasound  now that shows him to have a 14 x 10 cm regular inhomogeneous mass at the  central portion of his liver with obstructed bile ducts.  Since this  admission, he has also had a CAT scan confirming this and now has had  percutaneous drainage of this liver abscess.  Mr. Petra Kuba has seen Mr.  Heacock from a gastroenterology standpoint as well.  He suspected this  abscess was due to cholecystitis and symptomatic cholelithiasis.  Since  percutaneous drainage, the patient has remained febrile but has improved  symptomatically.  He reports he still has some mild right upper quadrant  pain but is able to tolerate a diet now.  I have been consulted on Mr.  Miles concerning cholecystectomy and further care of the liver abscess.  Currently is without further complaints and denies chest pain or shortness  of breath.   PAST MEDICAL HISTORY:  1. Hypertension.  2. He also has a history  of heavy alcohol abuse.   MEDICATIONS:  1. Norvasc.  2. Metoprolol.  3. Hydrochlorothiazide.  4. Multivitamins.  5. Aciphex.   ALLERGIES:  No known drug allergies.   PAST SURGICAL HISTORY:  No previous surgical history.   SOCIAL HISTORY:  He denied alcohol or drug abuse, however, he does drink 15  to 20 beers a day for the last 20 years but has recently quit.   FAMILY HISTORY:  Positive for heart disease and different types of cancer,  blood pressure, diabetes problems.   REVIEW OF SYMPTOMS:  Again, is unremarkable.  There is no dysuria.   PHYSICAL EXAMINATION:  GENERAL APPEARANCE:  A thin gentleman lying in bed in  no acute distress. He is well in appearance.  VITAL SIGNS:  Temperature 101.2, heart rate 102, respiratory rate 18, blood  pressure 137/74.  HEENT:  He has positive scleral icterus.  Pupils reactive bilaterally.  Ears, nose,  mouth and throat:  External ears and nose are normal.  Hearing  is normal.  Oropharynx is clear.  NECK:  Supple.  There is no cervical adenopathy.  There is no thyromegaly.  LUNGS:  Clear to auscultation bilaterally with normal respiratory effort.  CARDIOVASCULAR:  Regular rate and rhythm.  There is a 2/6 systolic ejection  murmur.  ABDOMEN:  Soft.  There is tenderness with some mild guarding in the right  upper quadrant.  There is a drainage catheter in the right upper quadrant  draining serobilious fluid.  There are no hernias, there is no further  organomegaly.  EXTREMITIES:  Warm and well perfused.  There is no clubbing, cyanosis, or  edema.  NEUROLOGIC:  He is grossly intact.   LABORATORY DATA:  The patient currently has a white blood cell count of  22.8, hemoglobin 8.8. Bilirubin is 4.6, alkaline phosphatase is 207.   IMPRESSION AND PLAN:  This is a 50 year old gentleman with cholecystitis and  probable secondary to large liver abscess which has been causing compressive  symptoms on the bile duct.  He has had improvement of symptoms  with  percutaneous drainage.  As this point, he is going back for another CAT scan  today to reevaluate the abscess to see if drains need to be changed or  repositioned, given the decrease in drainage.  I will be planning to proceed  with cholecystectomy as the patient stabilizes.  I discussed this with him  and his wife.  I suspect the gallbladder will be removed laparoscopically  and I can perform a cholangiogram; however, dealing with the liver abscess  may be difficult.  If his drainage cannot be performed percutaneously  successfully, then I will attempt laparoscopic drainage, although they  understand that open drainage may be necessary with placements of further  drains.  We will make our decision regarding the cholecystectomy after the  repeat CAT scan today.                                               Abigail Miyamoto, M.D.    DB/MEDQ  D:  05/27/2003  T:  05/28/2003  Job:  [id number]

## 2010-05-22 NOTE — Op Note (Signed)
NAMECARLON, Miguel [place]                     ACCOUNT NO.:  [id number]   MEDICAL RECORD NO.:  [id number]                   PATIENT TYPE:  INP   LOCATION:  5030                                 FACILITY:  [facility]   PHYSICIAN:  Abigail Miyamoto, M.D.              DATE OF BIRTH:  10/25/1960   DATE OF PROCEDURE:  05/28/2003  DATE OF DISCHARGE:                                 OPERATIVE REPORT   PREOPERATIVE DIAGNOSIS:  Liver abscess with cholecystitis and symptomatic  cholelithiasis.   POSTOPERATIVE DIAGNOSIS:  Liver abscess with cholecystitis and symptomatic  cholelithiasis.   OPERATION PERFORMED:  1. Exploratory laparotomy.  2. Cholecystectomy.  3. Cholangiogram.  4. Drainage of liver abscess.   SURGEON:  Douglas A. Magnus Ivan, M.D.   ASSISTANT:  Sheppard Plumber. Earlene Plater, M.D.   ANESTHESIA:  General endotracheal.   ESTIMATED BLOOD LOSS:  Minimal.   INDICATIONS FOR PROCEDURE:  Miguel [place] is a 50 year old gentleman  who presented with a large liver abscess causing obstructive jaundice.  He  had percutaneous drainage of this and had symptomatically improved; however,  repeat CAT scan still showed a large amount of fibrinous exudate in the  abscess cavity as well as now free fluid and purulence in the abdominal  cavity.  Given these findings, a decision was made to proceed with open  exploration.   FINDINGS:  The patient was found to have a large liver abscess as well as  free purulence below the liver as well as the right pericolic gutter.  Cholangiogram was performed and was normal without evidence of obstructing  stones.  There was no ascites and the liver was otherwise normal.   DESCRIPTION OF PROCEDURE:  The patient was brought to the operating room and  identified as Miguel [place].  He was placed supine on the operating  table and general anesthesia was induced.  His abdomen was prepped and  draped in the usual sterile fashion.  Using a #10 blade, a right subcostal  incision was created.  Incision was carried down through the anterior  fascia, the muscle and the posterior fascia with electrocautery.  The  peritoneum was then opened the entire length of the incision.  Upon entering  the abdomen, turbid purulent fluid was identified in the subhepatic space as  well as the right pericolic gutter.  The percutaneously placed catheter was  followed to the top of the liver and a spongy area could be palpated where  the abscess was present.  At this point decision was made to proceed with  cholecystectomy first.  The liver otherwise appeared normal without evidence  of cirrhosis.  The gallbladder was identified and grasped with a Kelly clamp  and then taken down from the liver bed in a dumbbell fashion with the  electrocautery.  The cystic artery was identified and clipped with surgical  clips. The posterior branch was identified also and clipped with surgical  clips.  The cystic duct was then identified  and clipped once distally.  A  small opening was then created with Metzenbaum scissors.  A  cholangiocatheter was then placed into the opening of the cystic duct.  A  cholangiogram was then performed under direct fluoroscopy with contrast.  Contrast was seen to flow into the entire biliary system and duodenum  without evidence of abnormality.  The bile duct itself did not appear  dilated.  At this point the cholangiocatheter was removed.  The cystic duct  was then clipped twice proximally and completely transected.  The  gallbladder was then sent to pathology for identification.  Hemostasis was  then achieved in the liver bed with electrocautery.  At this point the  pelvis was also examined.  The patient was found to have a large amount of  purulence again in the pelvis and right pericolic gutter.  This was  irrigated out with several liters of normal saline.  Next, a longitudinal  incision was made over the dome of the liver following along with the   catheter.  This was taken down at the surface of the liver and then the  large abscess cavity was identified.  A large amount of necrotic debris was  found in the large abscess cavity and was bluntly removed.  The cavity was  then thoroughly irrigated with normal saline.  Hemostasis was then achieved  with the electrocautery as well as the use of Avitene and pieces of  Surgicel.  The liver was packed and reexamined and hemostasis was felt to be  achieved.  Two separate lateral skin incisions were then created.  A 19  [place] Blake drain was placed in the gallbladder fossa and another 69 [place]  Blake drain was placed right into the abscess cavity on the dome of the  liver.  Both were sewn in place with nylon sutures.  The abdomen was once  again irrigated with normal saline.  Again the liver bed was examined and  hemostasis was felt to be achieved as well as in the abscess cavity. The  posterior fascia was then closed with running #1 PDS sutures.  The skin was  then again irrigated and the anterior fascia was likewise closed with  running #1 PDS suture.  The skin was then closed with skin staples.  Both  drains were placed to bulb suction.  The patient tolerated the procedure  well.  All sponge, needle and instrument counts were correct at the end of  the procedure.  The patient was then extubated in the operating room and  taken in stable condition to the recovery room.                                               Abigail Miyamoto, M.D.    DB/MEDQ  D:  05/28/2003  T:  05/28/2003  Job:  [id number]

## 2010-05-22 NOTE — Consult Note (Signed)
NAMEPHILLIPS, GOULETTE NO.:  [id number]   MEDICAL RECORD NO.:  [id number]                   [place] TYPE:  INP   LOCATION:  5030                                 FACILITY:  [facility]   PHYSICIAN:  Petra Kuba, M.D.                 DATE OF BIRTH:  [birth year]   DATE OF CONSULTATION:  DATE OF DISCHARGE:                                   CONSULTATION   CONSULTING PHYSICIAN:  Petra Kuba, M.D.   Miguel [place] is seen at Miguel request of Miguel [facility] as  well as Dr. Tasia Catchings for abnormal ultrasound, elevated liver tests.  Miguel [place] has been sick for about a month, initially presented with fever, chills,  nausea, vomiting, abdominal pain.  Actually Miguel pain has gotten better.  Miguel [place]  has continued to have night sweats.  His liver tests have been increasing,  initially told due to drinking which Miguel [place] has quit for about three weeks.  Miguel [place]  underwent a nondiagnostic colon and endo by Dr. Sherin Quarry and then underwent  an ultrasound yesterday which showed a probable liver mass.  CT today, which  was done at my request, after my discussion with Miguel [facility], showed Miguel large mass to seem more like an  abscess which was pushing on Miguel bile ducts.  Miguel [place] has no other specific  complaint.   PAST MEDICAL HISTORY:  1. High blood pressure.  2. Alcoholism.  3. History of anxiety/depression.  4. History of Afib.   MEDICINES AT HOME:  HCTZ, Norvasc, Metoprolol, Lexapro, and Aciphex.   ALLERGIES:  None.   FAMILY HISTORY:  Negative for any obvious GI problem or liver problem.   SOCIAL HISTORY:  Miguel [place] drinks as above.  Does not smoke.  Minimizes over Miguel  counter medicine use, except for a vitamin.   REVIEW OF SYSTEMS:  Pertinent for him actually not feeling too bad.   PHYSICAL EXAMINATION:  VITAL SIGNS:  Miguel [place] does have a low-grade temperature,  other vital signs stable.   LABS:  Pertinent for a white count of 18, down slightly  today.  Liver tests  increased significantly.  INR is 1.4.  Total bili 13.1, alk phos 311.  AST  91, ALT 84.   CT scan reviewed with both Dr. Fredia Sorrow and Dr. Molli Posey.   ASSESSMENT:  Probable liver abscess.   My guess is it is most likely from gallstones which showed up on Miguel CAT  scan.  Miguel [place] had a small shrunken gallbladder on ultrasound.  My plan would be  to drain this abscess ASAP.  I have already talked to Miguel [place] team and Miguel  radiologist.  Certainly, if this is a mass and not an abscess, we would get  a biopsy.  If it is an abscess, consult ID and surgery team.  Will probably  need a cholecystectomy at some point.  If draining Miguel abscess does  not  relieve Miguel biliary obstruction, then Miguel [place] may need an ERCP and certainly if  this is a mass will need an ERCP to unblock Miguel ducts.  Possibly, Miguel [place] will  need to add Flagyl to his Zosyn, if this is an abscess, but would ask ID as  above.  Will follow with you.  I have explained Miguel above to Miguel Washington and  Miguel family including Miguel risks and benefits of Miguel drainage.                                               Petra Kuba, M.D.    MEM/MEDQ  D:  05/25/2003  T:  05/26/2003  Job:  [id number]   cc:   Petra Kuba, M.D.  [address]  [place]  [place] [address]  Fax: [phone]   Tasia Catchings, M.D.  [address]  [place]  [place] [address]  Fax: [phone]   Donia Guiles, M.D.  [address]  [place] [address]  Fax: [phone]

## 2011-07-05 ENCOUNTER — Emergency Department (INDEPENDENT_AMBULATORY_CARE_PROVIDER_SITE_OTHER)
Admission: EM | Admit: 2011-07-05 | Discharge: 2011-07-05 | Disposition: A | Discharge status: Home / Self Care | Payer: Self-pay | Source: Home / Self Care | Attending: Family Medicine | Admitting: Family Medicine

## 2011-07-05 ENCOUNTER — Encounter (HOSPITAL_COMMUNITY): Payer: Self-pay

## 2011-07-05 DIAGNOSIS — I10 Essential (primary) hypertension: Secondary | ICD-10-CM

## 2011-07-05 HISTORY — DX: Essential (primary) hypertension: I10

## 2011-07-05 MED ORDER — HYDROCHLOROTHIAZIDE 25 MG PO TABS: 25.0000 mg | ORAL_TABLET | Freq: Every day | ORAL | Status: DC

## 2011-07-05 MED ORDER — METOPROLOL TARTRATE 100 MG PO TABS: 100.0000 mg | ORAL_TABLET | Freq: Two times a day (BID) | ORAL | Status: DC

## 2011-07-05 NOTE — ED Provider Notes (Signed)
History     CSN: [id number]  Arrival date & time 07/05/11  1625   First MD Initiated Contact with Patient 07/05/11 1644      Chief Complaint  Patient presents with  . Medication Refill    (Consider location/radiation/quality/duration/timing/severity/associated sxs/prior treatment) Patient is a 51 y.o. male presenting with hypertension. The history is provided by the patient.  Hypertension This is a new problem. Episode onset: expects to run out of bp med while oot with work , and lmd has retired, requests refill. The problem has not changed (last seen by lmd 5 mos ago--per pt.) since onset.Pertinent negatives include no chest pain, no abdominal pain, no headaches and no shortness of breath.    Past Medical History  Diagnosis Date  . Hypertension     Past Surgical History  Procedure Date  . Cholecystectomy     History reviewed. No pertinent family history.  History  Substance Use Topics  . Smoking status: Not on file  . Smokeless tobacco: Not on file  . Alcohol Use:       Review of Systems  Constitutional: Negative.   Respiratory: Negative for chest tightness and shortness of breath.   Cardiovascular: Negative for chest pain and palpitations.  Gastrointestinal: Negative for abdominal pain.  Neurological: Negative for headaches.    Allergies  Review of patient's allergies indicates no known allergies.  Home Medications   Current Outpatient Rx  Name Route Sig Dispense Refill  . HYDROCHLOROTHIAZIDE 25 MG PO TABS Oral Take 25 mg by mouth daily.    Marland Kitchen LORAZEPAM 0.5 MG PO TABS Oral Take 1 mg by mouth 1 day or 1 dose.    Marland Kitchen METOPROLOL TARTRATE 100 MG PO TABS Oral Take 100 mg by mouth 2 (two) times daily.    Marland Kitchen HYDROCHLOROTHIAZIDE 25 MG PO TABS Oral Take 1 tablet (25 mg total) by mouth daily. 30 tablet 1  . METOPROLOL TARTRATE 100 MG PO TABS Oral Take 1 tablet (100 mg total) by mouth 2 (two) times daily. 60 tablet 1    BP 145/89  Pulse 70  Temp 98.8 F (37.1 C)  (Oral)  Resp 12  SpO2 100%  Physical Exam  Nursing note and vitals reviewed. Constitutional: He is oriented to person, place, and time. He appears well-developed and well-nourished.  Neck: Normal range of motion. Neck supple.  Cardiovascular: Normal rate, regular rhythm, normal heart sounds and intact distal pulses.   Pulmonary/Chest: Effort normal and breath sounds normal.  Musculoskeletal: He exhibits no edema.  Lymphadenopathy:    He has no cervical adenopathy.  Neurological: He is alert and oriented to person, place, and time.  Skin: Skin is warm and dry.    ED Course  Procedures (including critical care time)  Labs Reviewed - No data to display No results found.   1. Hypertension, essential, benign       MDM          Linna Hoff, MD 07/05/11 [phone]

## 2011-07-05 NOTE — ED Notes (Signed)
Vitals obtained by RT student 

## 2011-07-05 NOTE — Discharge Instructions (Signed)
See your doctor for further medication refills. °

## 2011-07-05 NOTE — ED Notes (Signed)
Requesting Rx refills

## 2011-09-23 ENCOUNTER — Ambulatory Visit: Payer: Self-pay | Admitting: Emergency Medicine

## 2011-09-23 VITALS — BP 142/82 | HR 82 | Temp 97.9°F | Resp 16 | Ht 66.0 in | Wt 167.4 lb

## 2011-09-23 DIAGNOSIS — I10 Essential (primary) hypertension: Secondary | ICD-10-CM

## 2011-09-23 LAB — POCT CBC
Granulocyte percent: 50.4 %G (ref 37–80)
MID (cbc): 0.9 (ref 0–0.9)
MPV: 7.4 fL (ref 0–99.8)
POC MID %: 13.3 %M — AB (ref 0–12)
Platelet Count, POC: 251 10*3/uL (ref 142–424)
RBC: 5.23 M/uL (ref 4.69–6.13)

## 2011-09-23 MED ORDER — METOPROLOL TARTRATE 100 MG PO TABS: 100.0000 mg | ORAL_TABLET | Freq: Two times a day (BID) | ORAL | Status: DC

## 2011-09-23 MED ORDER — HYDROCHLOROTHIAZIDE 25 MG PO TABS: 25.0000 mg | ORAL_TABLET | Freq: Every day | ORAL | Status: DC

## 2011-09-23 NOTE — Progress Notes (Signed)
  Subjective:    Patient ID: Miguel Washington, male    DOB: 09-27-1960, 51 y.o.   MRN: [id number]  HPI shattered his blood pressure medication refilled. He denies any chest pain shortness of breath or any other significant problems at the present time. He has not had any swelling in his legs.    Review of Systems patient does admit to drinking about 9 beers per night. He knows this is a problem for him but he is not quite ready to enter a treatment program. He does not smoke     Objective:   Physical Exam  Constitutional: He appears well-developed and well-nourished.  HENT:  Head: Normocephalic.  Eyes: Pupils are equal, round, and reactive to light.  Neck: No tracheal deviation present. No thyromegaly present.  Cardiovascular: Normal rate, regular rhythm and normal heart sounds.   Pulmonary/Chest: Effort normal and breath sounds normal. No respiratory distress. He has no wheezes.  Abdominal: Bowel sounds are normal. There is no tenderness.       There is a large scar in the right upper quadrant with significant keloid formation   Results for orders placed in visit on 09/23/11  POCT CBC      Component Value Range   WBC 6.7  4.6 - 10.2 K/uL   Lymph, poc 2.4  0.6 - 3.4   POC LYMPH PERCENT 36.3  10 - 50 %L   MID (cbc) 0.9  0 - 0.9   POC MID % 13.3 (*) 0 - 12 %M   POC Granulocyte 3.4  2 - 6.9   Granulocyte percent 50.4  37 - 80 %G   RBC 5.23  4.69 - 6.13 M/uL   Hemoglobin 15.4  14.1 - 18.1 g/dL   HCT, POC 78.4  69.6 - 53.7 %   MCV 93.9  80 - 97 fL   MCH, POC 29.4  27 - 31.2 pg   MCHC 31.4 (*) 31.8 - 35.4 g/dL   RDW, POC 29.5     Platelet Count, POC 251  142 - 424 K/uL   MPV 7.4  0 - 99.8 fL         Assessment & Plan:  I offered patient opted to come and talk with me about an alcohol treatment program in the future. In the interim we'll go ahead and refill his medications metoprolol and hctz

## 2011-09-24 LAB — COMPREHENSIVE METABOLIC PANEL
ALT: 47 U/L (ref 0–53)
AST: 74 U/L — ABNORMAL HIGH (ref 0–37)
Albumin: 4.4 g/dL (ref 3.5–5.2)
Calcium: 9.8 mg/dL (ref 8.4–10.5)
Chloride: 92 mEq/L — ABNORMAL LOW (ref 96–112)
Potassium: 3.3 mEq/L — ABNORMAL LOW (ref 3.5–5.3)
Total Protein: 8.2 g/dL (ref 6.0–8.3)

## 2011-09-26 ENCOUNTER — Other Ambulatory Visit: Payer: Self-pay | Admitting: *Deleted

## 2011-09-26 MED ORDER — POTASSIUM CHLORIDE CRYS ER 20 MEQ PO TBCR: 20.0000 meq | EXTENDED_RELEASE_TABLET | Freq: Every day | ORAL | Status: DC

## 2012-10-12 ENCOUNTER — Other Ambulatory Visit: Payer: Self-pay | Admitting: Emergency Medicine

## 2012-11-17 ENCOUNTER — Other Ambulatory Visit: Payer: Self-pay | Admitting: Emergency Medicine

## 2012-11-17 ENCOUNTER — Telehealth: Payer: Self-pay

## 2012-11-17 MED ORDER — HYDROCHLOROTHIAZIDE 25 MG PO TABS: 25.0000 mg | ORAL_TABLET | Freq: Every day | ORAL | Status: DC

## 2012-11-17 MED ORDER — METOPROLOL TARTRATE 100 MG PO TABS: 100.0000 mg | ORAL_TABLET | Freq: Two times a day (BID) | ORAL | Status: DC

## 2012-11-17 NOTE — Telephone Encounter (Signed)
Pt called back to update ph number   [phone]  Gave wrong number first time

## 2012-11-17 NOTE — Telephone Encounter (Signed)
Pt states he is unable to come in and see Daub before he will run out of BP rx. Asks for refill and he says he will come in to see dr Cleta Alberts as soon as he can.   [facility]   ((pt states he called his pharmacy and they said they would send a request))  Best: [phone]

## 2012-11-17 NOTE — Telephone Encounter (Signed)
He has not been in greater than 1 yr. Please advise, he states he does not know when he will be able to come in, pended 2 wk supply

## 2012-11-17 NOTE — Telephone Encounter (Signed)
Two week supply sent.  No further refills.  Needs OV within the next 2 wks

## 2012-12-07 ENCOUNTER — Ambulatory Visit (INDEPENDENT_AMBULATORY_CARE_PROVIDER_SITE_OTHER): Payer: Self-pay | Admitting: Family Medicine

## 2012-12-07 VITALS — BP 164/92 | HR 84 | Temp 98.0°F | Resp 18 | Ht 67.0 in | Wt 159.0 lb

## 2012-12-07 DIAGNOSIS — E876 Hypokalemia: Secondary | ICD-10-CM

## 2012-12-07 DIAGNOSIS — L723 Sebaceous cyst: Secondary | ICD-10-CM

## 2012-12-07 DIAGNOSIS — I10 Essential (primary) hypertension: Secondary | ICD-10-CM

## 2012-12-07 DIAGNOSIS — F411 Generalized anxiety disorder: Secondary | ICD-10-CM

## 2012-12-07 DIAGNOSIS — F1010 Alcohol abuse, uncomplicated: Secondary | ICD-10-CM

## 2012-12-07 LAB — POCT CBC
Granulocyte percent: 43.2 %G (ref 37–80)
HCT, POC: 46.6 % (ref 43.5–53.7)
MCV: 95.8 fL (ref 80–97)
Platelet Count, POC: 158 10*3/uL (ref 142–424)
RBC: 4.86 M/uL (ref 4.69–6.13)

## 2012-12-07 MED ORDER — LORAZEPAM 0.5 MG PO TABS: 0.5000 mg | ORAL_TABLET | Freq: Every day | ORAL | Status: DC

## 2012-12-07 MED ORDER — HYDROCHLOROTHIAZIDE 25 MG PO TABS: 25.0000 mg | ORAL_TABLET | Freq: Every day | ORAL | Status: DC

## 2012-12-07 MED ORDER — METOPROLOL TARTRATE 100 MG PO TABS: 100.0000 mg | ORAL_TABLET | Freq: Two times a day (BID) | ORAL | Status: DC

## 2012-12-07 NOTE — Progress Notes (Addendum)
Subjective:  This chart was scribed for Miguel Simmer, MD by Carl Best, Medical Scribe. This patient was seen in Room 3 and the patient's care was started at 6:26 PM.   Patient ID: Miguel Washington, male    DOB: 1960/11/20, 52 y.o.   MRN: [id number]  HPI HPI Comments: Miguel Washington is a 52 y.o. male who presents to the [facility] complaining needing a refill for his blood pressure medication.  The patient states that he ran out of his medication a couple of days ago.  He states that his blood pressure will be 130/80-82 when he is on his blood pressure medication.  He states the checks his blood pressure 2-3 times a week.  He denies any SOB, leg swelling, headaches, mouth sores, dizziness as associated symptoms.  He denies having any other health problems other that his hypertension.  He denies having a history of MI or TIA.    He states that his mother had a history of DM and breast cancer that eventually spread to her brain.  He states that his mother was 44 years old when she died.  He states that she did not have any MI or TIA.  He states that his father had a history of CHF and Hypertension and died of an MI when he was 70-73.  He states that his father's first MI occurred when his father was in his 43s.  The patient states that he has 4 sisters one of which has DM. He states that some of his sisters have a history of Hypertension.  The patient states that he has 2 brothers and states they do not have any health problems.   The patient states that he is divorced but states that he has been together and living with his ex-wife for the past 10 years.  He states that he does not have any children.  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  He states that he has never used tobacco.  He states that he will drink a minimum 3-4 Vodka and Ginger Ales.  He states that he will drink a lot over the weekend when he is playing.  He states that he has  tried to quit using alcohol for small periods of time.  He states that he has been drinking for 20 years.  He states that he has never had a DWI.  He denies having a history of drug use.  He denies exercising regularly.  He denies having any allergies.  He denies taking any other medication other than his blood pressure medication.    He states that he would like to start taking Lorazepam again because he will experience 1-2 episodes a month of increased anxiety when traveling.  He states that he travels often.  The patient denies drinking caffeine.   He denies coming to [facility] for a physical.    Pt has a skin lesion that he would like evaluated; it has been present for years without enlargement.  No pain; no redness; no drainage.  Past Medical History  Diagnosis Date  . Hypertension   . Anxiety    Past Surgical History  Procedure Laterality Date  . Cholecystectomy     Family History  Problem Relation Age of Onset  . Cancer Mother     Breast cancer; brain metastasis  . Diabetes Mother   . Heart disease Father 36    AMI x 2.  . Hypertension  Father   . Hypertension Sister   . Hypertension Sister   . Diabetes Sister    History   Social History  . Marital Status: Divorced    Spouse Name: N/A    Number of Children: N/A  . Years of Education: N/A   Occupational History  . Not on file.   Social History Main Topics  . Smoking status: Never Smoker   . Smokeless tobacco: Not on file  . Alcohol Use: 12.6 oz/week    21 Shots of liquor per week  . Drug Use: No  . Sexual Activity: Not on file   Other Topics Concern  . Not on file   Social History Narrative   Marital status: divorced and now dating ex wife x 10 years.      Children: none      Lives: with ex-wife.      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  No DWIs.  Drinking daily x 20 years.      Drugs:  None      Exercise: none           No Known Allergies   Review of Systems  HENT: Negative for mouth sores.   Respiratory: Negative for cough, shortness of breath and stridor.   Cardiovascular: Negative for chest pain, palpitations and leg swelling.  Gastrointestinal: Negative for nausea, vomiting and abdominal pain.  Skin: Positive for wound.  Neurological: Negative for dizziness, tremors, seizures, syncope, facial asymmetry, speech difficulty, weakness, light-headedness, numbness and headaches.  Psychiatric/Behavioral: Negative for suicidal ideas, sleep disturbance, self-injury and dysphoric mood. The patient is nervous/anxious.      Objective:  Physical Exam  Nursing note and vitals reviewed. Constitutional: He is oriented to person, place, and time. He appears well-developed and well-nourished. No distress.  HENT:  Head: Normocephalic and atraumatic.  Right Ear: External ear normal.  Left Ear: External ear normal.  Nose: Nose normal.  Mouth/Throat: Oropharynx is clear and moist. No oropharyngeal exudate.  Eyes: Conjunctivae and EOM are normal. Pupils are equal, round, and reactive to light.  Neck: Normal range of motion. Neck supple. Carotid bruit is not present. No tracheal deviation present. No thyromegaly present.  Cardiovascular: Normal rate, regular rhythm, normal heart sounds and intact distal pulses.  Exam reveals no gallop and no friction rub.   No murmur heard. Pulmonary/Chest: Effort normal and breath sounds normal. No respiratory distress. He has no wheezes. He has no rales.  Abdominal: Soft. Bowel sounds are normal. He exhibits no distension and no mass. There is no tenderness. There is no rebound and no guarding.  Musculoskeletal: Normal range of motion. He exhibits no edema.  Lymphadenopathy:    He has no cervical adenopathy.  Neurological: He is alert and oriented to person, place, and time. No cranial nerve deficit.  Skin: Skin is warm and dry. No rash noted. He is not diaphoretic.   Large cystic lesion present without erythema, tenderness.  Psychiatric: He has a normal mood and affect. His behavior is normal.    Results for orders placed in visit on 12/07/12  COMPREHENSIVE METABOLIC PANEL      Result Value Ref Range   Sodium 137  135 - 145 mEq/L   Potassium 3.0 (*) 3.5 - 5.3 mEq/L   Chloride 91 (*) 96 - 112 mEq/L   CO2 34 (*) 19 - 32 mEq/L  Glucose, Bld 120 (*) 70 - 99 mg/dL   BUN 6  6 - 23 mg/dL   Creat 4.09  8.11 - 9.14 mg/dL   Total Bilirubin 0.9  0.3 - 1.2 mg/dL   Alkaline Phosphatase 60  39 - 117 U/L   AST 100 (*) 0 - 37 U/L   ALT 50  0 - 53 U/L   Total Protein 8.1  6.0 - 8.3 g/dL   Albumin 4.2  3.5 - 5.2 g/dL   Calcium 9.3  8.4 - 78.2 mg/dL  POCT CBC      Result Value Ref Range   WBC 3.3 (*) 4.6 - 10.2 K/uL   Lymph, poc 1.5  0.6 - 3.4   POC LYMPH PERCENT 46.1  10 - 50 %L   MID (cbc) 0.4  0 - 0.9   POC MID % 10.7  0 - 12 %M   POC Granulocyte 1.4 (*) 2 - 6.9   Granulocyte percent 43.2  37 - 80 %G   RBC 4.86  4.69 - 6.13 M/uL   Hemoglobin 14.6  14.1 - 18.1 g/dL   HCT, POC 95.6  21.3 - 53.7 %   MCV 95.8  80 - 97 fL   MCH, POC 30.0  27 - 31.2 pg   MCHC 31.3 (*) 31.8 - 35.4 g/dL   RDW, POC 08.6     Platelet Count, POC 158  142 - 424 K/uL   MPV 7.2  0 - 99.8 fL     Assessment & Plan:  Essential hypertension, benign - Plan: POCT CBC, Comprehensive metabolic panel  Sebaceous cyst  Hypokalemia - Plan: Basic metabolic panel  Anxiety state, unspecified  Alcohol abuse, daily use  1. HTN: uncontrolled due to non-compliance with medication in past two days; refills provided; obtain labs. 2.  Sebaceous cyst:  Present; reassurance provided; can have resected at his convenience. 3.  Hypokalemia:  New; present at visit last time; repeat BMET today. 4.  Anxiety: Intermittent; rx for Lorazepam provided; discouraged frequent use due to daily alcohol intake.  If becomes more symptomatic, recommend SSRI or SNRI. 5.  Alcohol use daily:  New; expressed  concern with daily alcohol use; encourage decrease intake to patient; discussed risk of developing cirrhosis.  Meds ordered this encounter  Medications  . hydrochlorothiazide (HYDRODIURIL) 25 MG tablet    Sig: Take 1 tablet (25 mg total) by mouth daily.    Dispense:  90 tablet    Refill:  3  . metoprolol (LOPRESSOR) 100 MG tablet    Sig: Take 1 tablet (100 mg total) by mouth 2 (two) times daily.    Dispense:  180 tablet    Refill:  3  . DISCONTD: LORazepam (ATIVAN) 0.5 MG tablet    Sig: Take 1-2 tablets (0.5-1 mg total) by mouth daily.    Dispense:  30 tablet    Refill:  0  . LORazepam (ATIVAN) 0.5 MG tablet    Sig: Take 1-2 tablets (0.5-1 mg total) by mouth daily.    Dispense:  30 tablet    Refill:  0  . potassium chloride SA (KLOR-CON M20) 20 MEQ tablet    Sig: Take 1 tablet (20 mEq total) by mouth daily. Take two tablets daily x 5 days then take one tablet daily    Dispense:  35 tablet    Refill:  11    I personally performed the services described in this documentation, which was scribed in my presence. The recorded information has been reviewed  and is accurate.  Miguel Washington, M.D.  [facility] [address] [place], [place]  [address] [phone] phone [phone] fax

## 2012-12-08 LAB — COMPREHENSIVE METABOLIC PANEL
AST: 100 U/L — ABNORMAL HIGH (ref 0–37)
Albumin: 4.2 g/dL (ref 3.5–5.2)
BUN: 6 mg/dL (ref 6–23)
Calcium: 9.3 mg/dL (ref 8.4–10.5)
Chloride: 91 mEq/L — ABNORMAL LOW (ref 96–112)
Potassium: 3 mEq/L — ABNORMAL LOW (ref 3.5–5.3)

## 2012-12-10 MED ORDER — POTASSIUM CHLORIDE CRYS ER 20 MEQ PO TBCR: 20.0000 meq | EXTENDED_RELEASE_TABLET | Freq: Every day | ORAL | Status: DC

## 2013-07-19 ENCOUNTER — Encounter: Payer: Self-pay | Admitting: Family Medicine

## 2013-08-24 ENCOUNTER — Inpatient Hospital Stay (HOSPITAL_COMMUNITY): Payer: Self-pay

## 2013-08-24 ENCOUNTER — Inpatient Hospital Stay (HOSPITAL_COMMUNITY)
Admission: EM | Admit: 2013-08-24 | Discharge: 2013-08-27 | DRG: 897 | Disposition: A | Discharge status: Home / Self Care | Payer: Self-pay | Attending: Family Medicine | Admitting: Family Medicine

## 2013-08-24 ENCOUNTER — Encounter (HOSPITAL_COMMUNITY): Payer: Self-pay | Admitting: Emergency Medicine

## 2013-08-24 DIAGNOSIS — R7989 Other specified abnormal findings of blood chemistry: Secondary | ICD-10-CM | POA: Diagnosis present

## 2013-08-24 DIAGNOSIS — I10 Essential (primary) hypertension: Secondary | ICD-10-CM | POA: Diagnosis present

## 2013-08-24 DIAGNOSIS — K7010 Alcoholic hepatitis without ascites: Secondary | ICD-10-CM | POA: Diagnosis present

## 2013-08-24 DIAGNOSIS — E871 Hypo-osmolality and hyponatremia: Secondary | ICD-10-CM | POA: Diagnosis present

## 2013-08-24 DIAGNOSIS — F1010 Alcohol abuse, uncomplicated: Secondary | ICD-10-CM

## 2013-08-24 DIAGNOSIS — R945 Abnormal results of liver function studies: Secondary | ICD-10-CM

## 2013-08-24 DIAGNOSIS — F411 Generalized anxiety disorder: Secondary | ICD-10-CM | POA: Diagnosis present

## 2013-08-24 DIAGNOSIS — D696 Thrombocytopenia, unspecified: Secondary | ICD-10-CM | POA: Diagnosis present

## 2013-08-24 DIAGNOSIS — F10229 Alcohol dependence with intoxication, unspecified: Principal | ICD-10-CM | POA: Diagnosis present

## 2013-08-24 DIAGNOSIS — F10929 Alcohol use, unspecified with intoxication, unspecified: Secondary | ICD-10-CM

## 2013-08-24 DIAGNOSIS — E876 Hypokalemia: Secondary | ICD-10-CM | POA: Diagnosis present

## 2013-08-24 LAB — COMPREHENSIVE METABOLIC PANEL
ALT: 74 U/L — ABNORMAL HIGH (ref 0–53)
ALT: 70 U/L — ABNORMAL HIGH (ref 0–53)
ANION GAP: 19 — AB (ref 5–15)
AST: 122 U/L — ABNORMAL HIGH (ref 0–37)
AST: 112 U/L — ABNORMAL HIGH (ref 0–37)
Albumin: 4.3 g/dL (ref 3.5–5.2)
Albumin: 3.9 g/dL (ref 3.5–5.2)
Alkaline Phosphatase: 69 U/L (ref 39–117)
Alkaline Phosphatase: 66 U/L (ref 39–117)
BILIRUBIN TOTAL: 2.6 mg/dL — AB (ref 0.3–1.2)
BUN: 5 mg/dL — AB (ref 6–23)
BUN: 4 mg/dL — AB (ref 6–23)
CALCIUM: 9.8 mg/dL (ref 8.4–10.5)
CALCIUM: 9 mg/dL (ref 8.4–10.5)
CO2: 32 meq/L (ref 19–32)
CO2: 33 mEq/L — ABNORMAL HIGH (ref 19–32)
CREATININE: 0.73 mg/dL (ref 0.50–1.35)
Chloride: <65 mEq/L — CL (ref 96–112)
Chloride: 65 mEq/L — ABNORMAL LOW (ref 96–112)
Creatinine, Ser: 0.68 mg/dL (ref 0.50–1.35)
GFR calc Af Amer: >90 mL/min (ref >90)
GFR calc non Af Amer: >90 mL/min (ref >90)
GLUCOSE: 129 mg/dL — AB (ref 70–99)
Glucose, Bld: 97 mg/dL (ref 70–99)
Potassium: <2.2 mEq/L — CL (ref 3.7–5.3)
Potassium: <2.2 mEq/L — CL (ref 3.7–5.3)
Sodium: 114 mEq/L — CL (ref 137–147)
Sodium: 117 mEq/L — CL (ref 137–147)
TOTAL PROTEIN: 8.3 g/dL (ref 6.0–8.3)
TOTAL PROTEIN: 7.8 g/dL (ref 6.0–8.3)
Total Bilirubin: 2.8 mg/dL — ABNORMAL HIGH (ref 0.3–1.2)

## 2013-08-24 LAB — CBC WITH DIFFERENTIAL/PLATELET
BASOS PCT: 0 % (ref 0–1)
Basophils Absolute: 0 10*3/uL (ref 0.0–0.1)
Basophils Absolute: 0 10*3/uL (ref 0.0–0.1)
Basophils Relative: 0 % (ref 0–1)
EOS ABS: 0 10*3/uL (ref 0.0–0.7)
EOS ABS: 0 10*3/uL (ref 0.0–0.7)
EOS PCT: 0 % (ref 0–5)
Eosinophils Relative: 0 % (ref 0–5)
HCT: 34.8 % — ABNORMAL LOW (ref 39.0–52.0)
HEMATOCRIT: 36.1 % — AB (ref 39.0–52.0)
Hemoglobin: 13.5 g/dL (ref 13.0–17.0)
Hemoglobin: 12.9 g/dL — ABNORMAL LOW (ref 13.0–17.0)
LYMPHS ABS: 0.4 10*3/uL — AB (ref 0.7–4.0)
LYMPHS ABS: 0.4 10*3/uL — AB (ref 0.7–4.0)
LYMPHS PCT: 14 % (ref 12–46)
Lymphocytes Relative: 8 % — ABNORMAL LOW (ref 12–46)
MCH: 30.1 pg (ref 26.0–34.0)
MCH: 29.7 pg (ref 26.0–34.0)
MCHC: 37.4 g/dL — ABNORMAL HIGH (ref 30.0–36.0)
MCHC: 37.1 g/dL — ABNORMAL HIGH (ref 30.0–36.0)
MCV: 80.6 fL (ref 78.0–100.0)
MCV: 80.2 fL (ref 78.0–100.0)
MONO ABS: 0.9 10*3/uL (ref 0.1–1.0)
Monocytes Absolute: 1.1 10*3/uL — ABNORMAL HIGH (ref 0.1–1.0)
Monocytes Relative: 27 % — ABNORMAL HIGH (ref 3–12)
Monocytes Relative: 19 % — ABNORMAL HIGH (ref 3–12)
Neutro Abs: 2 10*3/uL (ref 1.7–7.7)
Neutro Abs: 4.2 10*3/uL (ref 1.7–7.7)
Neutrophils Relative %: 60 % (ref 43–77)
Neutrophils Relative %: 74 % (ref 43–77)
Platelets: 114 10*3/uL — ABNORMAL LOW (ref 150–400)
Platelets: 122 10*3/uL — ABNORMAL LOW (ref 150–400)
RBC: 4.48 MIL/uL (ref 4.22–5.81)
RBC: 4.34 MIL/uL (ref 4.22–5.81)
RDW: 12.3 % (ref 11.5–15.5)
RDW: 12.3 % (ref 11.5–15.5)
WBC: 3.3 10*3/uL — AB (ref 4.0–10.5)
WBC: 5.7 10*3/uL (ref 4.0–10.5)

## 2013-08-24 LAB — PHOSPHORUS: Phosphorus: 2.4 mg/dL (ref 2.3–4.6)

## 2013-08-24 LAB — PROTIME-INR
INR: 1.06 (ref 0.00–1.49)
INR: 1.06 (ref 0.00–1.49)
PROTHROMBIN TIME: 13.8 s (ref 11.6–15.2)
Prothrombin Time: 13.8 seconds (ref 11.6–15.2)

## 2013-08-24 LAB — TROPONIN I

## 2013-08-24 LAB — LIPASE, BLOOD: LIPASE: 46 U/L (ref 11–59)

## 2013-08-24 LAB — ETHANOL: Alcohol, Ethyl (B): 66 mg/dL — ABNORMAL HIGH (ref 0–11)

## 2013-08-24 LAB — MAGNESIUM
Magnesium: 1.4 mg/dL — ABNORMAL LOW (ref 1.5–2.5)
Magnesium: 2.6 mg/dL — ABNORMAL HIGH (ref 1.5–2.5)

## 2013-08-24 LAB — APTT: APTT: 35 s (ref 24–37)

## 2013-08-24 LAB — MRSA PCR SCREENING: MRSA BY PCR: NEGATIVE

## 2013-08-24 MED ORDER — MAGNESIUM SULFATE 40 MG/ML IJ SOLN
2.0000 g | Freq: Once | INTRAMUSCULAR | Status: AC
  Administered 2013-08-24: 2 g via INTRAVENOUS
  Filled 2013-08-24 (×2): qty 50

## 2013-08-24 MED ORDER — POTASSIUM CHLORIDE CRYS ER 20 MEQ PO TBCR
40.0000 meq | EXTENDED_RELEASE_TABLET | Freq: Once | ORAL | Status: AC
  Administered 2013-08-24: 40 meq via ORAL
  Filled 2013-08-24: qty 2

## 2013-08-24 MED ORDER — SODIUM CHLORIDE 0.9 % IV SOLN
INTRAVENOUS | Status: DC
  Administered 2013-08-24: 21:00:00 via INTRAVENOUS
  Administered 2013-08-25: 500 mL via INTRAVENOUS

## 2013-08-24 MED ORDER — SODIUM CHLORIDE 0.9 % IV SOLN: Freq: Once | INTRAVENOUS | Status: DC

## 2013-08-24 MED ORDER — LORAZEPAM 2 MG/ML IJ SOLN
0.0000 mg | Freq: Two times a day (BID) | INTRAMUSCULAR | Status: DC
  Administered 2013-08-25: 1 mg via INTRAVENOUS

## 2013-08-24 MED ORDER — SODIUM CHLORIDE 0.9 % IJ SOLN
3.0000 mL | Freq: Two times a day (BID) | INTRAMUSCULAR | Status: DC
  Administered 2013-08-25: 3 mL via INTRAVENOUS

## 2013-08-24 MED ORDER — VITAMIN B-1 100 MG PO TABS
100.0000 mg | ORAL_TABLET | Freq: Every day | ORAL | Status: DC
  Administered 2013-08-24 – 2013-08-27 (×4): 100 mg via ORAL
  Filled 2013-08-24 (×4): qty 1

## 2013-08-24 MED ORDER — METOPROLOL TARTRATE 100 MG PO TABS
100.0000 mg | ORAL_TABLET | Freq: Two times a day (BID) | ORAL | Status: DC
  Administered 2013-08-24 – 2013-08-27 (×6): 100 mg via ORAL
  Filled 2013-08-24: qty 4
  Filled 2013-08-24 (×5): qty 1
  Filled 2013-08-24: qty 4

## 2013-08-24 MED ORDER — FOLIC ACID 1 MG PO TABS
1.0000 mg | ORAL_TABLET | Freq: Every day | ORAL | Status: DC
  Administered 2013-08-24 – 2013-08-27 (×4): 1 mg via ORAL
  Filled 2013-08-24 (×4): qty 1

## 2013-08-24 MED ORDER — ONDANSETRON HCL 4 MG/2ML IJ SOLN: 4.0000 mg | Freq: Four times a day (QID) | INTRAMUSCULAR | Status: DC | PRN

## 2013-08-24 MED ORDER — PNEUMOCOCCAL VAC POLYVALENT 25 MCG/0.5ML IJ INJ
0.5000 mL | INJECTION | INTRAMUSCULAR | Status: DC
  Filled 2013-08-24 (×3): qty 0.5

## 2013-08-24 MED ORDER — LORAZEPAM 2 MG/ML IJ SOLN
2.0000 mg | Freq: Once | INTRAMUSCULAR | Status: AC
  Administered 2013-08-24: 2 mg via INTRAVENOUS
  Filled 2013-08-24: qty 1

## 2013-08-24 MED ORDER — CETYLPYRIDINIUM CHLORIDE 0.05 % MT LIQD
7.0000 mL | Freq: Two times a day (BID) | OROMUCOSAL | Status: DC
  Administered 2013-08-25 – 2013-08-27 (×3): 7 mL via OROMUCOSAL

## 2013-08-24 MED ORDER — ADULT MULTIVITAMIN W/MINERALS CH
1.0000 | ORAL_TABLET | Freq: Every day | ORAL | Status: DC
  Administered 2013-08-24 – 2013-08-27 (×4): 1 via ORAL
  Filled 2013-08-24 (×4): qty 1

## 2013-08-24 MED ORDER — SODIUM CHLORIDE 0.9 % IV BOLUS (SEPSIS)
1000.0000 mL | Freq: Once | INTRAVENOUS | Status: AC
  Administered 2013-08-24: 1000 mL via INTRAVENOUS

## 2013-08-24 MED ORDER — LORAZEPAM 1 MG PO TABS
1.0000 mg | ORAL_TABLET | Freq: Four times a day (QID) | ORAL | Status: DC | PRN
  Administered 2013-08-27: 1 mg via ORAL
  Filled 2013-08-24: qty 1

## 2013-08-24 MED ORDER — THIAMINE HCL 100 MG/ML IJ SOLN
100.0000 mg | Freq: Every day | INTRAMUSCULAR | Status: DC
  Filled 2013-08-24 (×2): qty 1

## 2013-08-24 MED ORDER — LORAZEPAM 2 MG/ML IJ SOLN
0.0000 mg | Freq: Four times a day (QID) | INTRAMUSCULAR | Status: DC
  Administered 2013-08-25: 2 mg via INTRAVENOUS
  Filled 2013-08-24 (×2): qty 1

## 2013-08-24 MED ORDER — LORAZEPAM 1 MG PO TABS
0.0000 mg | ORAL_TABLET | Freq: Two times a day (BID) | ORAL | Status: DC
  Administered 2013-08-25: 2 mg via ORAL
  Filled 2013-08-24: qty 2

## 2013-08-24 MED ORDER — LORAZEPAM 1 MG PO TABS
0.0000 mg | ORAL_TABLET | Freq: Four times a day (QID) | ORAL | Status: AC
  Administered 2013-08-26 (×2): 1 mg via ORAL
  Filled 2013-08-24 (×2): qty 1

## 2013-08-24 MED ORDER — CHLORHEXIDINE GLUCONATE 0.12 % MT SOLN
15.0000 mL | Freq: Two times a day (BID) | OROMUCOSAL | Status: DC
  Administered 2013-08-25 – 2013-08-26 (×4): 15 mL via OROMUCOSAL
  Filled 2013-08-24 (×8): qty 15

## 2013-08-24 MED ORDER — ACETAMINOPHEN 650 MG RE SUPP: 650.0000 mg | Freq: Four times a day (QID) | RECTAL | Status: DC | PRN

## 2013-08-24 MED ORDER — LORAZEPAM 2 MG/ML IJ SOLN
1.0000 mg | Freq: Four times a day (QID) | INTRAMUSCULAR | Status: DC | PRN
  Administered 2013-08-26 (×2): 1 mg via INTRAVENOUS
  Filled 2013-08-24 (×2): qty 1

## 2013-08-24 MED ORDER — ONDANSETRON HCL 4 MG PO TABS: 4.0000 mg | ORAL_TABLET | Freq: Four times a day (QID) | ORAL | Status: DC | PRN

## 2013-08-24 MED ORDER — POTASSIUM CHLORIDE 10 MEQ/100ML IV SOLN
10.0000 meq | INTRAVENOUS | Status: AC
  Administered 2013-08-24 (×4): 10 meq via INTRAVENOUS
  Filled 2013-08-24 (×2): qty 100

## 2013-08-24 MED ORDER — ONDANSETRON HCL 4 MG/2ML IJ SOLN
4.0000 mg | Freq: Once | INTRAMUSCULAR | Status: AC
Start: 2013-08-24 — End: 2013-08-24
  Administered 2013-08-24: 4 mg via INTRAVENOUS
  Filled 2013-08-24: qty 2

## 2013-08-24 MED ORDER — ACETAMINOPHEN 325 MG PO TABS: 650.0000 mg | ORAL_TABLET | Freq: Four times a day (QID) | ORAL | Status: DC | PRN

## 2013-08-24 MED ORDER — THIAMINE HCL 100 MG/ML IJ SOLN: 100.0000 mg | Freq: Every day | INTRAMUSCULAR | Status: DC

## 2013-08-24 MED ORDER — VITAMIN B-1 100 MG PO TABS: 100.0000 mg | ORAL_TABLET | Freq: Every day | ORAL | Status: DC

## 2013-08-24 NOTE — ED Notes (Signed)
Per ems, pt was [place] off at fire station, pt took "his dogs outside around 3pm and woke up on the ground". Pt ambulated to EMS truck. Pt has ETOH abuse. Reports no ETOH today, has not eaten x2 days. Denies pain.

## 2013-08-24 NOTE — H&P (Signed)
[facility] History and Physical  Miguel BottomsChristopher Washington ZOX:[id number] DOB: 11-May-1960 DOA: 08/24/2013  Referring physician: ER physician PCP: No primary provider on file.   Chief Complaint: unresponsiveness  HPI:  53 year old male with apst medical history of alcohol abuse (drinks vodka almost daily), last alcoholic beverage 1 day prior to this admission, history of hypertension who [facility] after having few episodes of unresponsiveness while at home. Pt does not recall the events that led to loss of consciousness. He does know he would wake up on kitchen floor or on the ground outside but does not know for how long e has passed out or if he had a seizures. No reports of chest pain, shortness of breath, palpitations. No abdominal pain, nausea or vomiting. No reports of blood in stool or urine.   In ED, vitals were stable but his blood work showed severe electrolyte abnormalities such as potassium of less than 2.2, sodium of 114. Platelet count was 114, WBC count was 3.3. Alcohol level was 66 on admission. AST was 122, ALT 74 and bilirubin 2.8. Lipase was WNL. Troponin was WNL. The 12 lead EKG showed sinus rhythm.  Assessment & Plan    Principal Problem:   Acute alcohol intoxication  Alcohol level was 66 on this admission. CIWA protcol ordered (multivitamin, thiamine, folic acid)  Admission to stepdown unit for first 24 hour observation. Pt with extreme electrolyte abnormalities.  Monitor for withdrawals and /or seizures.  Replete electrolytes as needed.  Active Problems:   Hyponatremia  Secondary to dehydration, Hctz or alcohol abuse  Continue IV fluids  Follow up BMP in am   Hypertension  Hold Hctz but may restart metoprolol. BP 135/72.   Hypokalemia / Hypomagnesemia   Pt on Hctz which could have contributed to hypokalemia.  Potassium aggressively repleted. Magnesium repleted as well.    Abnormal LFTs  Secondary to alcohol induced  hepatitis  Obtain abdominal US  Trend LFT's. Lipase level WNL.   Thrombocytopenia  Secondary to bone marrow suppression from alcohol abuse  Continue to monitor CBC. No signs of bleeding.   DVT prophylaxis  SCD's bilaterally  Radiological Exams on Admission: No results found.  EKG: sinus rhythm  Code Status: Full Family Communication: Plan of care discussed with the patient  Disposition Plan: Admit for further evaluation  Manson PasseyEVINE, Kenyatta Keidel, MD  [facility] Pager [phone]  Review of Systems:  Constitutional: Negative for fever, chills and malaise/fatigue. Negative for diaphoresis.  HENT: Negative for hearing loss, ear pain, nosebleeds, congestion, sore throat, neck pain, tinnitus and ear discharge.   Eyes: Negative for blurred vision, double vision, photophobia, pain, discharge and redness.  Respiratory: Negative for cough, hemoptysis, sputum production, shortness of breath, wheezing and stridor.   Cardiovascular: Negative for chest pain, palpitations, orthopnea, claudication and leg swelling.  Gastrointestinal: Negative for nausea, vomiting and abdominal pain. Negative for heartburn, constipation, blood in stool and melena.  Genitourinary: Negative for dysuria, urgency, frequency, hematuria and flank pain.  Musculoskeletal: Negative for myalgias, back pain, joint pain and falls.  Skin: Negative for itching and rash.  Neurological: per HPI. Endo/Heme/Allergies: Negative for environmental allergies and polydipsia. Does not bruise/bleed easily.  Psychiatric/Behavioral: Negative for suicidal ideas. The patient is not nervous/anxious.      Past Medical History  Diagnosis Date  . Hypertension   . Anxiety    Past Surgical History  Procedure Laterality Date  . Cholecystectomy     Social History:  reports that he has never smoked. He  does not have any smokeless tobacco history on file. He reports that he drinks about 12.6 ounces of alcohol per week. He reports that he does  not use illicit drugs.  No Known Allergies  Family History:  Family History  Problem Relation Age of Onset  . Cancer Mother     Breast cancer; brain metastasis  . Diabetes Mother   . Heart disease Father 25    AMI x 2.  . Hypertension Father   . Hypertension Sister   . Hypertension Sister   . Diabetes Sister      Prior to Admission medications   Medication Sig Start Date End Date Taking? Authorizing Provider  hydrochlorothiazide (HYDRODIURIL) 25 MG tablet Take 1 tablet (25 mg total) by mouth daily. 12/07/12  Yes Ethelda Chick, MD  LORazepam (ATIVAN) 0.5 MG tablet Take 1-2 tablets (0.5-1 mg total) by mouth daily. 12/07/12  Yes Ethelda Chick, MD  metoprolol (LOPRESSOR) 100 MG tablet Take 1 tablet (100 mg total) by mouth 2 (two) times daily. 12/07/12  Yes Ethelda Chick, MD  potassium chloride SA (KLOR-CON M20) 20 MEQ tablet Take 1 tablet (20 mEq total) by mouth daily. Take two tablets daily x 5 days then take one tablet daily 12/10/12  Yes Ethelda Chick, MD   Physical Exam: Filed Vitals:   08/24/13 1753 08/24/13 1806 08/24/13 1917  BP: 139/72  135/72  Pulse: 65  71  Temp:  98 F (36.7 C) 98.1 F (36.7 C)  TempSrc:  Oral Oral  Resp: 20  15  SpO2: 100%  100%    Physical Exam  Constitutional: Appears in no acute distress HENT: Normocephalic. No tonsillar erythema or exudates Eyes: Conjunctivae and EOM are normal. PERRLA, no scleral icterus.  Neck: Normal ROM. Neck supple. No JVD. No tracheal deviation. No thyromegaly.  CVS: RRR, S1/S2 +, no murmurs, no gallops, no carotid bruit.  Pulmonary: Effort and breath sounds normal, no stridor, rhonchi, wheezes, rales.  Abdominal: Soft. BS +,  no distension, tenderness, rebound or guarding.  Musculoskeletal: Normal range of motion. No edema and no tenderness.  Lymphadenopathy: No lymphadenopathy noted, cervical, inguinal. Neuro: Alert. Normal reflexes, muscle tone coordination. No focal neurologic deficits. Skin: Skin is warm and  dry. No rash noted. Not diaphoretic. No erythema. No pallor.  Psychiatric: Normal mood and affect. Behavior, judgment, thought content normal.   Labs on Admission:  Basic Metabolic Panel:  Recent Labs Lab 08/24/13 1821  NA 114*  K <2.2*  CL <65*  CO2 32  GLUCOSE 129*  BUN 5*  CREATININE 0.73  CALCIUM 9.8   Liver Function Tests:  Recent Labs Lab 08/24/13 1821  AST 122*  ALT 74*  ALKPHOS 69  BILITOT 2.8*  PROT 8.3  ALBUMIN 4.3    Recent Labs Lab 08/24/13 1821  LIPASE 46   No results found for this basename: AMMONIA,  in the last 168 hours CBC:  Recent Labs Lab 08/24/13 1821  WBC 3.3*  NEUTROABS 2.0  HGB 13.5  HCT 36.1*  MCV 80.6  PLT 114*   Cardiac Enzymes:  Recent Labs Lab 08/24/13 1821  TROPONINI <0.30   BNP: No components found with this basename: POCBNP,  CBG: No results found for this basename: GLUCAP,  in the last 168 hours  If 7PM-7AM, please contact night-coverage [email or web address] Password [facility] 08/24/2013, 8:19 PM

## 2013-08-24 NOTE — Progress Notes (Signed)
CRITICAL VALUE ALERT  Critical value received:Na 117 and K >2.2 Date of notification:  08/24/13  Time of notification:  2300 Critical value read back:Yes.    Nurse who received alert:  c Larna Capelle  MD notified (1st page):  [facility] Time of first page:  2320 MD notified (2nd page):  Time of second page:  Responding ZO:XWRUED:[facility] np  Time MD responded: 2320

## 2013-08-24 NOTE — ED Provider Notes (Addendum)
CSN: [id number]     Arrival date & time 08/24/13  1730 History   First MD Initiated Contact with Patient 08/24/13 1738     Chief Complaint  Patient presents with  . Near Syncope  . ETOH withdrawal       HPI  Patient presents after 2 episodes of being unresponsive at his home. Unknown seizure vs syncope, as unwitnessed.  He states that he is alcoholic, and he drinks vodka every day. His last drink he states was "sometime last night about 8:00".  He states that he's been drinking more over the last few days. He states he has really not eaten or drank for a much other than alcohol for the last few days.   He said he put his dog suddenly outside tonight. He states the next thing he knew he was "waking up" on his deck. He got his dogs inside. He states again that "the next thing I knew" he was unconscious on his kitchen floor. He does not know the exact amount of time for either episode, however he feels like it was "just a few minutes". He has a scrape on his knee but no other areas of pain or apparent injury. He states he's been feeling shaky since this morning. He tends to get shaky of days he does not drink. He denies ever having had a seizure before. No history of heart disease but he is hypertensive. States that he is compliant with hydrochlorothiazide, and metoprolol. States he used to be on lorazepam, but has not taken any for several months.  He states that a buddy came to pick him up at his house because "we had a gig". 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 He felt poorly and told his buddy of the episodes, so he was driven to the [employer].    Past Medical History  Diagnosis Date  . Hypertension   . Anxiety    Past Surgical History  Procedure Laterality Date  . Cholecystectomy     Family History  Problem Relation Age of Onset  . Cancer Mother     Breast cancer; brain metastasis  . Diabetes Mother   . Heart disease Father 84    AMI x 2.  . Hypertension Father   . Hypertension  Sister   . Hypertension Sister   . Diabetes Sister    History  Substance Use Topics  . Smoking status: Never Smoker   . Smokeless tobacco: Not on file  . Alcohol Use: 12.6 oz/week    21 Shots of liquor per week    Review of Systems  Constitutional: Negative for fever, chills, diaphoresis, appetite change and fatigue.  HENT: Negative for mouth sores, sore throat and trouble swallowing.   Eyes: Negative for visual disturbance.  Respiratory: Negative for cough, chest tightness, shortness of breath and wheezing.   Cardiovascular: Negative for chest pain.  Gastrointestinal: Negative for nausea, vomiting, abdominal pain, diarrhea and abdominal distention.  Endocrine: Negative for polydipsia, polyphagia and polyuria.  Genitourinary: Negative for dysuria, frequency and hematuria.  Musculoskeletal: Negative for gait problem.  Skin: Negative for color change, pallor and rash.  Neurological: Positive for tremors. Negative for dizziness, syncope, light-headedness and headaches.       Syncope or seizure? He was not incontinent. He did not bite his tongue. No shoulder pain. No neck or back pain.  Hematological: Does not bruise/bleed easily.  Psychiatric/Behavioral: Negative for behavioral problems and confusion.      Allergies  Review of  patient's allergies indicates no known allergies.  Home Medications   Prior to Admission medications   Medication Sig Start Date End Date Taking? Authorizing Provider  hydrochlorothiazide (HYDRODIURIL) 25 MG tablet Take 1 tablet (25 mg total) by mouth daily. 12/07/12  Yes Ethelda ChickKristi M Smith, MD  LORazepam (ATIVAN) 0.5 MG tablet Take 1-2 tablets (0.5-1 mg total) by mouth daily. 12/07/12  Yes Ethelda ChickKristi M Smith, MD  metoprolol (LOPRESSOR) 100 MG tablet Take 1 tablet (100 mg total) by mouth 2 (two) times daily. 12/07/12  Yes Ethelda ChickKristi M Smith, MD  potassium chloride SA (KLOR-CON M20) 20 MEQ tablet Take 1 tablet (20 mEq total) by mouth daily. Take two tablets daily x 5 days  then take one tablet daily 12/10/12  Yes Ethelda ChickKristi M Smith, MD   BP 135/72  Pulse 71  Temp(Src) 98.1 F (36.7 C) (Oral)  Resp 15  SpO2 100% Physical Exam  Constitutional: He is oriented to person, place, and time. He appears well-developed and well-nourished. No distress.  HENT:  Head: Normocephalic.  Eyes: Conjunctivae are normal. Pupils are equal, round, and reactive to light. No scleral icterus.  Neck: Normal range of motion. Neck supple. No thyromegaly present.  Cardiovascular: Normal rate and regular rhythm.  Exam reveals no gallop and no friction rub.   No murmur heard. Pulmonary/Chest: Effort normal and breath sounds normal. No respiratory distress. He has no wheezes. He has no rales.  Abdominal: Soft. Bowel sounds are normal. He exhibits no distension. There is no tenderness. There is no rebound.  Musculoskeletal: Normal range of motion.       Legs: Neurological: He is alert and oriented to person, place, and time.  Final resting tremor. No asterixis. Awake alert oriented lucid.  Skin: Skin is warm and dry. No rash noted.  Psychiatric: He has a normal mood and affect. His behavior is normal.    ED Course  Procedures (including critical care time) Labs Review Labs Reviewed  CBC WITH DIFFERENTIAL - Abnormal; Notable for the following:    WBC 3.3 (*)    HCT 36.1 (*)    MCHC 37.4 (*)    Platelets 114 (*)    Lymphs Abs 0.4 (*)    Monocytes Relative 27 (*)    All other components within normal limits  COMPREHENSIVE METABOLIC PANEL - Abnormal; Notable for the following:    Sodium 114 (*)    Glucose, Bld 129 (*)    BUN 5 (*)    AST 122 (*)    ALT 74 (*)    Total Bilirubin 2.8 (*)    All other components within normal limits  ETHANOL - Abnormal; Notable for the following:    Alcohol, Ethyl (B) 66 (*)    All other components within normal limits  LIPASE, BLOOD  TROPONIN I  PROTIME-INR  I-STAT CHEM 8, ED    Imaging Review No results found.   EKG  Interpretation None      MDM   Final diagnoses:  Hyponatremia   Na+ 114.  Repeat to confirm: 112.Marland Kitchen.  Pt given NS 1 l, then infusion.  Ativan 2mg  IV.  Resting comfortably. I will discuss with [facility]:  Admit for Na+ correction, CIWA.  Hyponatremia likely multifactorial, poor by mouth intake, thiazide diuretic, alcoholism.    Rolland PorterMark Josiah Nieto, MD 08/24/13 Norberta Keens1921  Rolland PorterMark Tonnia Bardin, MD 08/24/13 [phone]

## 2013-08-24 NOTE — ED Notes (Signed)
[facility] Expected date:  Expected time:  Means of arrival:  Comments: ems- syncopal episode

## 2013-08-24 NOTE — ED Notes (Signed)
Pt reports drinking 6-7 "vodka" a day. Last drink yesterday.

## 2013-08-24 NOTE — ED Notes (Signed)
i gave chem 8 to Dr Fayrene FearingJames.Marland Kitchen.KLJ

## 2013-08-25 LAB — BASIC METABOLIC PANEL
Anion gap: 12 (ref 5–15)
Anion gap: 12 (ref 5–15)
Anion gap: 12 (ref 5–15)
BUN: 5 mg/dL — AB (ref 6–23)
BUN: 5 mg/dL — ABNORMAL LOW (ref 6–23)
BUN: 5 mg/dL — ABNORMAL LOW (ref 6–23)
CALCIUM: 9.1 mg/dL (ref 8.4–10.5)
CALCIUM: 9 mg/dL (ref 8.4–10.5)
CO2: 34 meq/L — AB (ref 19–32)
CO2: 33 mEq/L — ABNORMAL HIGH (ref 19–32)
CO2: 33 mEq/L — ABNORMAL HIGH (ref 19–32)
Calcium: 9.2 mg/dL (ref 8.4–10.5)
Chloride: 79 mEq/L — ABNORMAL LOW (ref 96–112)
Chloride: 83 mEq/L — ABNORMAL LOW (ref 96–112)
Chloride: 86 mEq/L — ABNORMAL LOW (ref 96–112)
Creatinine, Ser: 0.76 mg/dL (ref 0.50–1.35)
Creatinine, Ser: 0.77 mg/dL (ref 0.50–1.35)
Creatinine, Ser: 0.77 mg/dL (ref 0.50–1.35)
GFR calc Af Amer: >90 mL/min (ref >90)
GFR calc Af Amer: >90 mL/min (ref >90)
GFR calc Af Amer: >90 mL/min (ref >90)
GLUCOSE: 84 mg/dL (ref 70–99)
GLUCOSE: 86 mg/dL (ref 70–99)
Glucose, Bld: 82 mg/dL (ref 70–99)
Potassium: 2.7 mEq/L — CL (ref 3.7–5.3)
Potassium: 3.5 mEq/L — ABNORMAL LOW (ref 3.7–5.3)
Potassium: 4 mEq/L (ref 3.7–5.3)
Sodium: 125 mEq/L — ABNORMAL LOW (ref 137–147)
Sodium: 128 mEq/L — ABNORMAL LOW (ref 137–147)
Sodium: 131 mEq/L — ABNORMAL LOW (ref 137–147)

## 2013-08-25 LAB — COMPREHENSIVE METABOLIC PANEL
ALT: 63 U/L — ABNORMAL HIGH (ref 0–53)
ANION GAP: 15 (ref 5–15)
AST: 99 U/L — ABNORMAL HIGH (ref 0–37)
Albumin: 3.7 g/dL (ref 3.5–5.2)
Alkaline Phosphatase: 62 U/L (ref 39–117)
BUN: 4 mg/dL — AB (ref 6–23)
CO2: 35 mEq/L — ABNORMAL HIGH (ref 19–32)
Calcium: 9 mg/dL (ref 8.4–10.5)
Chloride: 73 mEq/L — ABNORMAL LOW (ref 96–112)
Creatinine, Ser: 0.75 mg/dL (ref 0.50–1.35)
GFR calc non Af Amer: >90 mL/min (ref >90)
GLUCOSE: 91 mg/dL (ref 70–99)
Potassium: 3 mEq/L — ABNORMAL LOW (ref 3.7–5.3)
SODIUM: 123 meq/L — AB (ref 137–147)
TOTAL PROTEIN: 7.5 g/dL (ref 6.0–8.3)
Total Bilirubin: 2 mg/dL — ABNORMAL HIGH (ref 0.3–1.2)

## 2013-08-25 LAB — RAPID URINE DRUG SCREEN, HOSP PERFORMED
AMPHETAMINES: NOT DETECTED
BARBITURATES: NOT DETECTED
BENZODIAZEPINES: NOT DETECTED
Cocaine: NOT DETECTED
Opiates: NOT DETECTED
TETRAHYDROCANNABINOL: NOT DETECTED

## 2013-08-25 LAB — CBC
HCT: 34.5 % — ABNORMAL LOW (ref 39.0–52.0)
HEMOGLOBIN: 12.5 g/dL — AB (ref 13.0–17.0)
MCH: 29.8 pg (ref 26.0–34.0)
MCHC: 36.2 g/dL — ABNORMAL HIGH (ref 30.0–36.0)
MCV: 82.1 fL (ref 78.0–100.0)
Platelets: 108 10*3/uL — ABNORMAL LOW (ref 150–400)
RBC: 4.2 MIL/uL — AB (ref 4.22–5.81)
RDW: 12.5 % (ref 11.5–15.5)
WBC: 5.8 10*3/uL (ref 4.0–10.5)

## 2013-08-25 LAB — GLUCOSE, CAPILLARY: GLUCOSE-CAPILLARY: 81 mg/dL (ref 70–99)

## 2013-08-25 LAB — PROTIME-INR
INR: 1.07 (ref 0.00–1.49)
PROTHROMBIN TIME: 13.9 s (ref 11.6–15.2)

## 2013-08-25 LAB — TSH: TSH: 0.632 u[IU]/mL (ref 0.350–4.500)

## 2013-08-25 LAB — APTT: aPTT: 32 seconds (ref 24–37)

## 2013-08-25 MED ORDER — POTASSIUM CHLORIDE 10 MEQ/100ML IV SOLN
10.0000 meq | INTRAVENOUS | Status: AC
  Administered 2013-08-25 (×3): 10 meq via INTRAVENOUS
  Filled 2013-08-25 (×4): qty 100

## 2013-08-25 MED ORDER — POTASSIUM CHLORIDE CRYS ER 20 MEQ PO TBCR
40.0000 meq | EXTENDED_RELEASE_TABLET | Freq: Once | ORAL | Status: AC
  Administered 2013-08-25: 40 meq via ORAL
  Filled 2013-08-25: qty 2

## 2013-08-25 MED ORDER — LORAZEPAM 1 MG PO TABS: 1.0000 mg | ORAL_TABLET | Freq: Four times a day (QID) | ORAL | Status: DC | PRN

## 2013-08-25 MED ORDER — POTASSIUM CHLORIDE 10 MEQ/100ML IV SOLN
10.0000 meq | INTRAVENOUS | Status: AC
  Administered 2013-08-25 (×4): 10 meq via INTRAVENOUS
  Filled 2013-08-25 (×4): qty 100

## 2013-08-25 MED ORDER — LORAZEPAM 2 MG/ML IJ SOLN: 1.0000 mg | Freq: Four times a day (QID) | INTRAMUSCULAR | Status: DC | PRN

## 2013-08-25 MED ORDER — SODIUM CHLORIDE 0.9 % IV SOLN
INTRAVENOUS | Status: DC
  Administered 2013-08-25: 20:00:00 via INTRAVENOUS

## 2013-08-25 NOTE — Progress Notes (Signed)
[facility] PROGRESS NOTE  Miguel BottomsChristopher Washington JYN:[id number] DOB: 1961/01/03 DOA: 08/24/2013 PCP: No primary provider on file.  Assessment/Plan:  Principal Problem:   Acute alcohol intoxication - Continue CIWA protocol  Active Problems:   HYPERTENSION, BENIGN ESSENTIAL - B blocker on board and blood pressure relatively well controlled.    Hyponatremia - obtain serial BMP's q 6 hours - most likely due to excessive alcohol intake - trending up with normal saline, will continue    Hypokalemia - Order placed for 4 runs of 10 meq of potassium chloride this AM.  Will reassess prior to replacing. - Magnesium replaced, magnesium deficiency most likely contributing.    Abnormal LFTs - Trending down and most likely 2ary to alcohol use    Thrombocytopenia - Most likely due to prolonged alcohol use  Code Status: full Family Communication:  Disposition Plan: transfer to telemetry   Consultants:  None  Procedures:  none  Antibiotics:  None  HPI/Subjective: Pt has no new complaints. No acute issues reported overnight.  Objective: Filed Vitals:   08/25/13 0800  BP:   Pulse:   Temp: 98.5 F (36.9 C)  Resp:     Intake/Output Summary (Last 24 hours) at 08/25/13 1021 Last data filed at 08/25/13 0700  Gross per 24 hour  Intake 1808.33 ml  Output   3350 ml  Net -1541.67 ml   Filed Weights   08/24/13 2042 08/25/13 0400  Weight: 71.7 kg (158 lb 1.1 oz) 69.9 kg (154 lb 1.6 oz)    Exam:   General:  Pt in nad, alert and awake  Cardiovascular: rrr, no mrg  Respiratory: cta bl, no wheezes  Abdomen: soft, NT, ND  Musculoskeletal: no cyanosis or clubbing   Data Reviewed: Basic Metabolic Panel:  Recent Labs Lab 08/24/13 1821 08/24/13 2218 08/25/13 0345  NA 114* 117* 123*  K <2.2* <2.2* 3.0*  CL <65* 65* 73*  CO2 32 33* 35*  GLUCOSE 129* 97 91  BUN 5* 4* 4*  CREATININE 0.73 0.68 0.75  CALCIUM 9.8 9.0 9.0  MG 1.4* 2.6*  --   PHOS  --  2.4  --     Liver Function Tests:  Recent Labs Lab 08/24/13 1821 08/24/13 2218 08/25/13 0345  AST 122* 112* 99*  ALT 74* 70* 63*  ALKPHOS 69 66 62  BILITOT 2.8* 2.6* 2.0*  PROT 8.3 7.8 7.5  ALBUMIN 4.3 3.9 3.7    Recent Labs Lab 08/24/13 1821  LIPASE 46   No results found for this basename: AMMONIA,  in the last 168 hours CBC:  Recent Labs Lab 08/24/13 1821 08/24/13 2218 08/25/13 0345  WBC 3.3* 5.7 5.8  NEUTROABS 2.0 4.2  --   HGB 13.5 12.9* 12.5*  HCT 36.1* 34.8* 34.5*  MCV 80.6 80.2 82.1  PLT 114* 122* 108*   Cardiac Enzymes:  Recent Labs Lab 08/24/13 1821  TROPONINI <0.30   BNP (last 3 results) No results found for this basename: PROBNP,  in the last 8760 hours CBG:  Recent Labs Lab 08/25/13 0752  GLUCAP 81    Recent Results (from the past 240 hour(s))  MRSA PCR SCREENING     Status: None   Collection Time    08/24/13  8:58 PM      Result Value Ref Range Status   MRSA by PCR NEGATIVE  NEGATIVE Final   Comment:            The GeneXpert MRSA Assay (FDA     approved for NASAL specimens  only), is one component of a     comprehensive MRSA colonization     surveillance program. It is not     intended to diagnose MRSA     infection nor to guide or     monitor treatment for     MRSA infections.     Studies: US Abdomen Complete  08/24/2013   CLINICAL DATA:  Abnormal hepatic function studies ; history of cholecystectomy and alcohol abuse  EXAM: ULTRASOUND ABDOMEN COMPLETE  COMPARISON:  None.  FINDINGS: Gallbladder:  The gallbladder is surgically absent  Common bile duct:  Diameter: .  3.8 mm  Liver:  The echotexture of the liver is heterogeneous. There is no focal mass nor ductal dilation.  IVC:  Evaluation limited by bowel gas  Pancreas:  The pancreas was obscured by bowel gas.  Spleen:  The spleen is small measuring no more than 3 cm where visualized.  Right Kidney:  Length: 10.7 cm. Echogenicity within normal limits. No mass or hydronephrosis  visualized.  Left Kidney:  Length: 11.2 cm. Echogenicity within normal limits. No mass or hydronephrosis visualized.  Abdominal aorta:  No aneurysm visualized. The infrarenal aorta and bifurcation were obscured by bowel gas  Other findings:  No ascites is demonstrated.  The urinary bladder is distended  IMPRESSION: 1. The liver demonstrates heterogeneous echotexture but no focal mass. It does not appear shrunken nor its surface irregular. The spleen is small. The pancreas was obscured by bowel gas. 2. There is no acute abnormality of the kidneys. The urinary bladder is distended.   Electronically Signed   By: David  [place]   On: 08/24/2013 21:46    Scheduled Meds: . antiseptic oral rinse  7 mL Mouth Rinse q12n4p  . chlorhexidine  15 mL Mouth Rinse BID  . folic acid  1 mg Oral Daily  . LORazepam  0-4 mg Intravenous 4 times per day  . LORazepam  0-4 mg Intravenous Q12H  . LORazepam  0-4 mg Oral 4 times per day  . LORazepam  0-4 mg Oral Q12H  . metoprolol  100 mg Oral BID  . multivitamin with minerals  1 tablet Oral Daily  . pneumococcal 23 valent vaccine  0.5 mL Intramuscular Tomorrow-1000  . sodium chloride  3 mL Intravenous Q12H  . thiamine  100 mg Oral Daily   Or  . thiamine  100 mg Intravenous Daily   Continuous Infusions: . sodium chloride 125 mL/hr at 08/24/13 2056    Time spent: > 35 minutes    Penny Pia  [facility] Pager [phone]. If 7PM-7AM, please contact night-coverage at [email or web address], password [facility] 08/25/2013, 10:21 AM  LOS: 1 day

## 2013-08-25 NOTE — Progress Notes (Signed)
CRITICAL VALUE ALERT  Critical value received: k+ 2.7  Date of notification:  08/26/2003  Time of notification:  13:45  Critical value read back:yes  Nurse who received alert:  Lorrin JacksonAmy Britnie Colville RN  MD notified (1st page):  Dr. Cena BentonVega  Time of first page:  13:45  MD notified (2nd page):  Time of second page:  Responding MD: Dr. Cena BentonVega  Time MD responded:  1353, orders placed for K+ runs and po.

## 2013-08-26 LAB — BASIC METABOLIC PANEL
Anion gap: 10 (ref 5–15)
Anion gap: 11 (ref 5–15)
BUN: 6 mg/dL (ref 6–23)
BUN: 6 mg/dL (ref 6–23)
CHLORIDE: 86 meq/L — AB (ref 96–112)
CHLORIDE: 87 meq/L — AB (ref 96–112)
CO2: 33 meq/L — AB (ref 19–32)
CO2: 31 mEq/L (ref 19–32)
Calcium: 8.9 mg/dL (ref 8.4–10.5)
Calcium: 8.8 mg/dL (ref 8.4–10.5)
Creatinine, Ser: 0.8 mg/dL (ref 0.50–1.35)
Creatinine, Ser: 0.67 mg/dL (ref 0.50–1.35)
GFR calc Af Amer: >90 mL/min (ref >90)
GFR calc Af Amer: >90 mL/min (ref >90)
GFR calc non Af Amer: >90 mL/min (ref >90)
GLUCOSE: 100 mg/dL — AB (ref 70–99)
GLUCOSE: 78 mg/dL (ref 70–99)
POTASSIUM: 3.4 meq/L — AB (ref 3.7–5.3)
POTASSIUM: 3 meq/L — AB (ref 3.7–5.3)
Sodium: 129 mEq/L — ABNORMAL LOW (ref 137–147)
Sodium: 129 mEq/L — ABNORMAL LOW (ref 137–147)

## 2013-08-26 LAB — GLUCOSE, CAPILLARY
Glucose-Capillary: 84 mg/dL (ref 70–99)
Glucose-Capillary: 126 mg/dL — ABNORMAL HIGH (ref 70–99)

## 2013-08-26 MED ORDER — LORAZEPAM 2 MG/ML IJ SOLN
2.0000 mg | Freq: Once | INTRAMUSCULAR | Status: AC
  Administered 2013-08-26: 2 mg via INTRAVENOUS
  Filled 2013-08-26: qty 1

## 2013-08-26 MED ORDER — HALOPERIDOL LACTATE 5 MG/ML IJ SOLN
5.0000 mg | Freq: Once | INTRAMUSCULAR | Status: AC
  Administered 2013-08-26: 5 mg via INTRAVENOUS
  Filled 2013-08-26: qty 1

## 2013-08-26 MED ORDER — POTASSIUM CHLORIDE CRYS ER 20 MEQ PO TBCR
40.0000 meq | EXTENDED_RELEASE_TABLET | Freq: Once | ORAL | Status: AC
  Administered 2013-08-26: 40 meq via ORAL
  Filled 2013-08-26 (×2): qty 2

## 2013-08-26 NOTE — Progress Notes (Signed)
Last night, patient got a total of  of Ativan and he still was getting up every 15 minutes, very agitated, and confused. Ativan was just not working for him. No tremors or sweating. Then this morning patient pulled out their IV. MD was notified and new orders were given for a one time dose of Haldol.

## 2013-08-26 NOTE — Progress Notes (Signed)
[facility] PROGRESS NOTE  Miguel Washington ZOX:[id number] DOB: 10-08-1960 DOA: 08/24/2013 PCP: No primary provider on file.  Assessment/Plan:  Principal Problem:   Acute alcohol intoxication - Continue CIWA protocol  Active Problems:   HYPERTENSION, BENIGN ESSENTIAL - B blocker on board and blood pressure relatively well controlled.    Hyponatremia - resolving. Will reassess next am. - most likely due to excessive alcohol intake - trending up with normal saline, will continue    Hypokalemia - Replace orally and reassess next am. - Magnesium replaced, magnesium deficiency most likely contributing.    Abnormal LFTs - Trending down and most likely 2ary to alcohol use    Thrombocytopenia - Most likely due to prolonged alcohol use  Code Status: full Family Communication: None at bedside Disposition Plan: Discontinue cardiac monitoring given no reported red flags and improvement in K levels.   Consultants:  None  Procedures:  none  Antibiotics:  None  HPI/Subjective: Pt has no new complaints. No acute issues reported overnight. He denies any chest discomfort  Objective: Filed Vitals:   08/26/13 1403  BP: 131/61  Pulse: 72  Temp: 98.4 F (36.9 C)  Resp: 20    Intake/Output Summary (Last 24 hours) at 08/26/13 1640 Last data filed at 08/26/13 1404  Gross per 24 hour  Intake 1586.25 ml  Output   1500 ml  Net  86.25 ml   Filed Weights   08/24/13 2042 08/25/13 0400 08/25/13 1503  Weight: 71.7 kg (158 lb 1.1 oz) 69.9 kg (154 lb 1.6 oz) 70.308 kg (155 lb)    Exam:   General:  Pt in nad, alert and awake  Cardiovascular: rrr, no mrg  Respiratory: cta bl, no wheezes  Abdomen: soft, NT, ND  Musculoskeletal: no cyanosis or clubbing   Data Reviewed: Basic Metabolic Panel:  Recent Labs Lab 08/24/13 1821 08/24/13 2218  08/25/13 1246 08/25/13 1630 08/25/13 2214 08/26/13 0430 08/26/13 1028  NA 114* 117*  < > 125* 128* 131* 129* 129*   K <2.2* <2.2*  < > 2.7* 3.5* 4.0 3.4* 3.0*  CL <65* 65*  < > 79* 83* 86* 86* 87*  CO2 32 33*  < > 34* 33* 33* 33* 31  GLUCOSE 129* 97  < > 82 84 86 100* 78  BUN 5* 4*  < > 5* 5* 5* 6 6  CREATININE 0.73 0.68  < > 0.76 0.77 0.77 0.80 0.67  CALCIUM 9.8 9.0  < > 9.2 9.1 9.0 8.9 8.8  MG 1.4* 2.6*  --   --   --   --   --   --   PHOS  --  2.4  --   --   --   --   --   --   < > = values in this interval not displayed. Liver Function Tests:  Recent Labs Lab 08/24/13 1821 08/24/13 2218 08/25/13 0345  AST 122* 112* 99*  ALT 74* 70* 63*  ALKPHOS 69 66 62  BILITOT 2.8* 2.6* 2.0*  PROT 8.3 7.8 7.5  ALBUMIN 4.3 3.9 3.7    Recent Labs Lab 08/24/13 1821  LIPASE 46   No results found for this basename: AMMONIA,  in the last 168 hours CBC:  Recent Labs Lab 08/24/13 1821 08/24/13 2218 08/25/13 0345  WBC 3.3* 5.7 5.8  NEUTROABS 2.0 4.2  --   HGB 13.5 12.9* 12.5*  HCT 36.1* 34.8* 34.5*  MCV 80.6 80.2 82.1  PLT 114* 122* 108*   Cardiac Enzymes:  Recent  Labs Lab 08/24/13 1821  TROPONINI <0.30   BNP (last 3 results) No results found for this basename: PROBNP,  in the last 8760 hours CBG:  Recent Labs Lab 08/25/13 0752 08/26/13 1124  GLUCAP 81 84    Recent Results (from the past 240 hour(s))  MRSA PCR SCREENING     Status: None   Collection Time    08/24/13  8:58 PM      Result Value Ref Range Status   MRSA by PCR NEGATIVE  NEGATIVE Final   Comment:            The GeneXpert MRSA Assay (FDA     approved for NASAL specimens     only), is one component of a     comprehensive MRSA colonization     surveillance program. It is not     intended to diagnose MRSA     infection nor to guide or     monitor treatment for     MRSA infections.     Studies: US Abdomen Complete  08/24/2013   CLINICAL DATA:  Abnormal hepatic function studies ; history of cholecystectomy and alcohol abuse  EXAM: ULTRASOUND ABDOMEN COMPLETE  COMPARISON:  None.  FINDINGS: Gallbladder:  The  gallbladder is surgically absent  Common bile duct:  Diameter: .  3.8 mm  Liver:  The echotexture of the liver is heterogeneous. There is no focal mass nor ductal dilation.  IVC:  Evaluation limited by bowel gas  Pancreas:  The pancreas was obscured by bowel gas.  Spleen:  The spleen is small measuring no more than 3 cm where visualized.  Right Kidney:  Length: 10.7 cm. Echogenicity within normal limits. No mass or hydronephrosis visualized.  Left Kidney:  Length: 11.2 cm. Echogenicity within normal limits. No mass or hydronephrosis visualized.  Abdominal aorta:  No aneurysm visualized. The infrarenal aorta and bifurcation were obscured by bowel gas  Other findings:  No ascites is demonstrated.  The urinary bladder is distended  IMPRESSION: 1. The liver demonstrates heterogeneous echotexture but no focal mass. It does not appear shrunken nor its surface irregular. The spleen is small. The pancreas was obscured by bowel gas. 2. There is no acute abnormality of the kidneys. The urinary bladder is distended.   Electronically Signed   By: David  [place]   On: 08/24/2013 21:46    Scheduled Meds: . antiseptic oral rinse  7 mL Mouth Rinse q12n4p  . chlorhexidine  15 mL Mouth Rinse BID  . folic acid  1 mg Oral Daily  . LORazepam  0-4 mg Oral 4 times per day  . metoprolol  100 mg Oral BID  . multivitamin with minerals  1 tablet Oral Daily  . pneumococcal 23 valent vaccine  0.5 mL Intramuscular Tomorrow-1000  . sodium chloride  3 mL Intravenous Q12H  . thiamine  100 mg Oral Daily   Or  . thiamine  100 mg Intravenous Daily   Continuous Infusions:    Time spent: > 35 minutes    Penny Pia  [facility] Pager [phone]. If 7PM-7AM, please contact night-coverage at [email or web address], password [facility] 08/26/2013, 4:40 PM  LOS: 2 days

## 2013-08-26 NOTE — Progress Notes (Signed)
After Haldol was given, patient finally calmed down and went to sleep after being up all night.

## 2013-08-27 LAB — BASIC METABOLIC PANEL
Anion gap: 12 (ref 5–15)
BUN: 5 mg/dL — AB (ref 6–23)
CHLORIDE: 89 meq/L — AB (ref 96–112)
CO2: 27 mEq/L (ref 19–32)
Calcium: 8.7 mg/dL (ref 8.4–10.5)
Creatinine, Ser: 0.72 mg/dL (ref 0.50–1.35)
GFR calc Af Amer: >90 mL/min (ref >90)
GFR calc non Af Amer: >90 mL/min (ref >90)
GLUCOSE: 119 mg/dL — AB (ref 70–99)
Potassium: 3.1 mEq/L — ABNORMAL LOW (ref 3.7–5.3)
Sodium: 128 mEq/L — ABNORMAL LOW (ref 137–147)

## 2013-08-27 LAB — GLUCOSE, CAPILLARY: GLUCOSE-CAPILLARY: 148 mg/dL — AB (ref 70–99)

## 2013-08-27 MED ORDER — PNEUMOCOCCAL VAC POLYVALENT 25 MCG/0.5ML IJ INJ
0.5000 mL | INJECTION | INTRAMUSCULAR | Status: AC | PRN
  Administered 2013-08-27: 0.5 mL via INTRAMUSCULAR
  Filled 2013-08-27: qty 0.5

## 2013-08-27 MED ORDER — FOLIC ACID 1 MG PO TABS: 1.0000 mg | ORAL_TABLET | Freq: Every day | ORAL | Status: DC

## 2013-08-27 NOTE — Care Management Note (Signed)
    Page 1 of 1   08/27/2013     4:02:53 PM CARE MANAGEMENT NOTE 08/27/2013  Patient:  Miguel Washington,Miguel Washington   Account Number:  [id number]  Date Initiated:  08/27/2013  Documentation initiated by:  Lanier Clam  Subjective/Objective Assessment:   53 Y/O M ADMITTED W/ETOH INTOXICATION.     Action/Plan:   FROM HOME.   Anticipated DC Date:  08/27/2013   Anticipated DC Plan:  HOME/SELF CARE      DC Planning Services  CM consult      Choice offered to / List presented to:             Status of service:  Completed, signed off Medicare Important Message given?   (If response is "NO", the following Medicare IM given date fields will be blank) Date Medicare IM given:   Medicare IM given by:   Date Additional Medicare IM given:   Additional Medicare IM given by:    Discharge Disposition:  HOME/SELF CARE  Per UR Regulation:  Reviewed for med. necessity/level of care/duration of stay  If discussed at Long Length of Stay Meetings, dates discussed:    Comments:

## 2013-08-27 NOTE — Plan of Care (Signed)
Problem: Phase I Progression Outcomes Goal: OOB as tolerated unless otherwise ordered Outcome: Progressing Pt ambulating with assistance from staff

## 2013-08-27 NOTE — Discharge Summary (Signed)
Physician Discharge Summary  Miguel Washington ZOX:[id number] DOB: 09/17/1960 DOA: 08/24/2013  PCP: No primary provider on file.  Admit date: 08/24/2013 Discharge date: 08/27/2013  Time spent: > 35 minutes  Recommendations for Outpatient Follow-up:  1. Please reassess sodium and potassium levels  Discharge Diagnoses:  Principal Problem:   Acute alcohol intoxication Active Problems:   HYPERTENSION, BENIGN ESSENTIAL   Hyponatremia   Hypokalemia   Abnormal LFTs   Thrombocytopenia   Discharge Condition: stable  Diet recommendation: Heart healthy  Filed Weights   08/25/13 0400 08/25/13 1503 08/27/13 0444  Weight: 69.9 kg (154 lb 1.6 oz) 70.308 kg (155 lb) 69.31 kg (152 lb 12.8 oz)    History of present illness:  53 y/o with h/o alcohol abuse who presented to the ED 2ary to unresponsiveness. Had hyponatremia and hypokalemia.  Hospital Course:  Principal Problem:  Acute alcohol intoxication  - recommended alcohol cessation or moderation - No withdrawal signs on discharge.  Active Problems:  HYPERTENSION, BENIGN ESSENTIAL  - B blocker on board and blood pressure relatively well controlled.  - will discontinue hctz given hyponatremia and hypokalemia  Hyponatremia  - Suspect will trend up with improved oral intake and moderation of alcohol.  Hypokalemia  - Replace orally prior to d/c - Magnesium replaced  Abnormal LFTs  - Trending down and most likely 2ary to alcohol use   Thrombocytopenia  - Most likely due to prolonged alcohol use    Procedures:  NOne  Consultations:  none  Discharge Exam: Filed Vitals:   08/27/13 0444  BP: 145/88  Pulse: 72  Temp: 98.8 F (37.1 C)  Resp: 20    General: pt in nad, alert and awake Cardiovascular: rrr, no mrg Respiratory: cta bl, no wheezes  Discharge Instructions You were cared for by a hospitalist during your hospital stay. If you have any questions about your discharge medications or the care you received  while you were in the hospital after you are discharged, you can call the unit and asked to speak with the hospitalist on call if the hospitalist that took care of you is not available. Once you are discharged, your primary care physician will handle any further medical issues. Please note that NO REFILLS for any discharge medications will be authorized once you are discharged, as it is imperative that you return to your primary care physician (or establish a relationship with a primary care physician if you do not have one) for your aftercare needs so that they can reassess your need for medications and monitor your lab values.  Discharge Instructions   Call MD for:  extreme fatigue    Complete by:  As directed      Call MD for:  temperature >100.4    Complete by:  As directed      Diet - low sodium heart healthy    Complete by:  As directed      Increase activity slowly    Complete by:  As directed             Medication List    STOP taking these medications       hydrochlorothiazide 25 MG tablet  Commonly known as:  HYDRODIURIL     potassium chloride SA 20 MEQ tablet  Commonly known as:  KLOR-CON M20      TAKE these medications       folic acid 1 MG tablet  Commonly known as:  FOLVITE  Take 1 tablet (1 mg total) by mouth daily.  LORazepam 0.5 MG tablet  Commonly known as:  ATIVAN  Take 1-2 tablets (0.5-1 mg total) by mouth daily.     metoprolol 100 MG tablet  Commonly known as:  LOPRESSOR  Take 1 tablet (100 mg total) by mouth 2 (two) times daily.       No Known Allergies    The results of significant diagnostics from this hospitalization (including imaging, microbiology, ancillary and laboratory) are listed below for reference.    Significant Diagnostic Studies: US Abdomen Complete  08/24/2013   CLINICAL DATA:  Abnormal hepatic function studies ; history of cholecystectomy and alcohol abuse  EXAM: ULTRASOUND ABDOMEN COMPLETE  COMPARISON:  None.  FINDINGS:  Gallbladder:  The gallbladder is surgically absent  Common bile duct:  Diameter: .  3.8 mm  Liver:  The echotexture of the liver is heterogeneous. There is no focal mass nor ductal dilation.  IVC:  Evaluation limited by bowel gas  Pancreas:  The pancreas was obscured by bowel gas.  Spleen:  The spleen is small measuring no more than 3 cm where visualized.  Right Kidney:  Length: 10.7 cm. Echogenicity within normal limits. No mass or hydronephrosis visualized.  Left Kidney:  Length: 11.2 cm. Echogenicity within normal limits. No mass or hydronephrosis visualized.  Abdominal aorta:  No aneurysm visualized. The infrarenal aorta and bifurcation were obscured by bowel gas  Other findings:  No ascites is demonstrated.  The urinary bladder is distended  IMPRESSION: 1. The liver demonstrates heterogeneous echotexture but no focal mass. It does not appear shrunken nor its surface irregular. The spleen is small. The pancreas was obscured by bowel gas. 2. There is no acute abnormality of the kidneys. The urinary bladder is distended.   Electronically Signed   By: David  [place]   On: 08/24/2013 21:46    Microbiology: Recent Results (from the past 240 hour(s))  MRSA PCR SCREENING     Status: None   Collection Time    08/24/13  8:58 PM      Result Value Ref Range Status   MRSA by PCR NEGATIVE  NEGATIVE Final   Comment:            The GeneXpert MRSA Assay (FDA     approved for NASAL specimens     only), is one component of a     comprehensive MRSA colonization     surveillance program. It is not     intended to diagnose MRSA     infection nor to guide or     monitor treatment for     MRSA infections.     Labs: Basic Metabolic Panel:  Recent Labs Lab 08/24/13 1821 08/24/13 2218  08/25/13 1630 08/25/13 2214 08/26/13 0430 08/26/13 1028 08/27/13 0427  NA 114* 117*  < > 128* 131* 129* 129* 128*  K <2.2* <2.2*  < > 3.5* 4.0 3.4* 3.0* 3.1*  CL <65* 65*  < > 83* 86* 86* 87* 89*  CO2 32 33*  < > 33* 33*  33* 31 27  GLUCOSE 129* 97  < > 84 86 100* 78 119*  BUN 5* 4*  < > 5* 5* 6 6 5*  CREATININE 0.73 0.68  < > 0.77 0.77 0.80 0.67 0.72  CALCIUM 9.8 9.0  < > 9.1 9.0 8.9 8.8 8.7  MG 1.4* 2.6*  --   --   --   --   --   --   PHOS  --  2.4  --   --   --   --   --   --   < > =  values in this interval not displayed. Liver Function Tests:  Recent Labs Lab 08/24/13 1821 08/24/13 2218 08/25/13 0345  AST 122* 112* 99*  ALT 74* 70* 63*  ALKPHOS 69 66 62  BILITOT 2.8* 2.6* 2.0*  PROT 8.3 7.8 7.5  ALBUMIN 4.3 3.9 3.7    Recent Labs Lab 08/24/13 1821  LIPASE 46   No results found for this basename: AMMONIA,  in the last 168 hours CBC:  Recent Labs Lab 08/24/13 1821 08/24/13 2218 08/25/13 0345  WBC 3.3* 5.7 5.8  NEUTROABS 2.0 4.2  --   HGB 13.5 12.9* 12.5*  HCT 36.1* 34.8* 34.5*  MCV 80.6 80.2 82.1  PLT 114* 122* 108*   Cardiac Enzymes:  Recent Labs Lab 08/24/13 1821  TROPONINI <0.30   BNP: BNP (last 3 results) No results found for this basename: PROBNP,  in the last 8760 hours CBG:  Recent Labs Lab 08/25/13 0752 08/26/13 1124 08/26/13 2152 08/27/13 0715  GLUCAP 81 84 126* 148*       Signed:  Penny Pia  [facility] 08/27/2013, 11:44 AM

## 2013-12-10 ENCOUNTER — Ambulatory Visit (INDEPENDENT_AMBULATORY_CARE_PROVIDER_SITE_OTHER): Payer: Self-pay | Admitting: Family Medicine

## 2013-12-10 VITALS — BP 142/84 | HR 72 | Temp 98.3°F | Resp 16 | Ht 66.5 in | Wt 162.0 lb

## 2013-12-10 DIAGNOSIS — I10 Essential (primary) hypertension: Secondary | ICD-10-CM

## 2013-12-10 DIAGNOSIS — F10288 Alcohol dependence with other alcohol-induced disorder: Secondary | ICD-10-CM

## 2013-12-10 MED ORDER — METOPROLOL TARTRATE 100 MG PO TABS: 100.0000 mg | ORAL_TABLET | Freq: Two times a day (BID) | ORAL | Status: DC

## 2013-12-10 MED ORDER — AMLODIPINE BESYLATE 2.5 MG PO TABS: 2.5000 mg | ORAL_TABLET | Freq: Every day | ORAL | Status: DC

## 2013-12-10 NOTE — Patient Instructions (Signed)
Please call the clinic below to get an appointment asap.  They are a clinic set up by [facility] to care for people without health insurance so may be able to get you care at much cheaper cost with a large discount on labs, imaging, and medication. [facility] [address]  [place], [place] [id number] Hours of Operation Mon - Fri: 9 a.m. - 6 p.m. Main: [phone] The orange card program is sponsored by the [facility]. Call [phone] to see if this might be something for which you are eligible. This can ensure you have access to the medical and dental services that [facility] help provide to people without health insurance.  Finding Treatment for Alcohol and Drug Addiction It can be hard to find the right place to get professional treatment. Here are some important things to consider:  There are different types of treatment to choose from.  Some programs are live-in (residential) while others are not (outpatient). Sometimes a combination is offered.  No single type of program is right for everyone.  Most treatment programs involve a combination of education, counseling, and a 12-step, spiritually-based approach.  There are non-spiritually based programs (not 12-step).  Some treatment programs are government sponsored. They are geared for patients without private insurance.  Treatment programs can vary in many respects such as:  Cost and types of insurance accepted.  Types of on-site medical services offered.  Length of stay, setting, and size.  Overall philosophy of treatment. A person may need specialized treatment or have needs not addressed by all programs. For example, adolescents need treatment appropriate for their age. Other people have secondary disorders that must be managed as well. Secondary conditions can include mental illness, such as depression or diabetes. Often, a period of  detoxification from alcohol or drugs is needed. This requires medical supervision and not all programs offer this. THINGS TO CONSIDER WHEN SELECTING A TREATMENT PROGRAM   Is the program certified by the appropriate government agency? Even private programs must be certified and employ certified professionals.  Does the program accept your insurance? If not, can a payment plan be set up?  Is the facility clean, organized, and well run? Do they allow you to speak with graduates who can share their treatment experience with you? Can you tour the facility? Can you meet with staff?  Does the program meet the full range of individual needs?  Does the treatment program address sexual orientation and physical disabilities? Do they provide age, gender, and culturally appropriate treatment services?  Is treatment available in languages other than English?  Is long-term aftercare support or guidance encouraged and provided?  Is assessment of an individual's treatment plan ongoing to ensure it meets changing needs?  Does the program use strategies to encourage reluctant patients to remain in treatment long enough to increase the likelihood of success?  Does the program offer counseling (individual or group) and other behavioral therapies?  Does the program offer medicine as part of the treatment regimen, if needed?  Is there ongoing monitoring of possible relapse? Is there a defined relapse prevention program? Are services or referrals offered to family members to ensure they understand addiction and the recovery process? This would help them support the recovering individual.  Are 12-step meetings held at the center or is transport available for patients to attend outside meetings? In countries outside of the [place], Magazine features editorsee local directories for contact information for  services in your area. Document Released: 11/19/2004 Document Revised: 03/15/2011 Document Reviewed: 06/01/2007 [facility]  Patient Information 2015 [place]. This information is not intended to replace advice given to you by your health care provider. Make sure you discuss any questions you have with your health care provider.  Managing Your High Blood Pressure Blood pressure is a measurement of how forceful your blood is pressing against the walls of the arteries. Arteries are muscular tubes within the circulatory system. Blood pressure does not stay the same. Blood pressure rises when you are active, excited, or nervous; and it lowers during sleep and relaxation. If the numbers measuring your blood pressure stay above normal most of the time, you are at risk for health problems. High blood pressure (hypertension) is a long-term (chronic) condition in which blood pressure is elevated. A blood pressure reading is recorded as two numbers, such as 120 over 80 (or 120/80). The first, higher number is called the systolic pressure. It is a measure of the pressure in your arteries as the heart beats. The second, lower number is called the diastolic pressure. It is a measure of the pressure in your arteries as the heart relaxes between beats.  Keeping your blood pressure in a normal range is important to your overall health and prevention of health problems, such as heart disease and stroke. When your blood pressure is uncontrolled, your heart has to work harder than normal. High blood pressure is a very common condition in adults because blood pressure tends to rise with age. Men and women are equally likely to have hypertension but at different times in life. Before age 52, men are more likely to have hypertension. After 52 years of age, women are more likely to have it. Hypertension is especially common in African Americans. This condition often has no signs or symptoms. The cause of the condition is usually not known. Your caregiver can help you come up with a plan to keep your blood pressure in a normal, healthy range. BLOOD  PRESSURE STAGES Blood pressure is classified into four stages: normal, prehypertension, stage 1, and stage 2. Your blood pressure reading will be used to determine what type of treatment, if any, is necessary. Appropriate treatment options are tied to these four stages:  Normal  Systolic pressure (mm Hg): below 120.  Diastolic pressure (mm Hg): below 80. Prehypertension  Systolic pressure (mm Hg): 120 to 139.  Diastolic pressure (mm Hg): 80 to 89. Stage1  Systolic pressure (mm Hg): 140 to 159.  Diastolic pressure (mm Hg): 90 to 99. Stage2  Systolic pressure (mm Hg): 160 or above.  Diastolic pressure (mm Hg): 100 or above. RISKS RELATED TO HIGH BLOOD PRESSURE Managing your blood pressure is an important responsibility. Uncontrolled high blood pressure can lead to:  A heart attack.  A stroke.  A weakened blood vessel (aneurysm).  Heart failure.  Kidney damage.  Eye damage.  Metabolic syndrome.  Memory and concentration problems. HOW TO MANAGE YOUR BLOOD PRESSURE Blood pressure can be managed effectively with lifestyle changes and medicines (if needed). Your caregiver will help you come up with a plan to bring your blood pressure within a normal range. Your plan should include the following: Education  Read all information provided by your caregivers about how to control blood pressure.  Educate yourself on the latest guidelines and treatment recommendations. New research is always being done to further define the risks and treatments for high blood pressure. Lifestylechanges  Control your weight.  Avoid smoking.  Stay physically active.  Reduce the amount of salt in your diet.  Reduce stress.  Control any chronic conditions, such as high cholesterol or diabetes.  Reduce your alcohol intake. Medicines  Several medicines (antihypertensive medicines) are available, if needed, to bring blood pressure within a normal range. Communication  Review all the  medicines you take with your caregiver because there may be side effects or interactions.  Talk with your caregiver about your diet, exercise habits, and other lifestyle factors that may be contributing to high blood pressure.  See your caregiver regularly. Your caregiver can help you create and adjust your plan for managing high blood pressure. RECOMMENDATIONS FOR TREATMENT AND FOLLOW-UP  The following recommendations are based on current guidelines for managing high blood pressure in nonpregnant adults. Use these recommendations to identify the proper follow-up period or treatment option based on your blood pressure reading. You can discuss these options with your caregiver.  Systolic pressure of 120 to 139 or diastolic pressure of 80 to 89: Follow up with your caregiver as directed.  Systolic pressure of 140 to 160 or diastolic pressure of 90 to 100: Follow up with your caregiver within 2 months.  Systolic pressure above 160 or diastolic pressure above 100: Follow up with your caregiver within 1 month.  Systolic pressure above 180 or diastolic pressure above 110: Consider antihypertensive therapy; follow up with your caregiver within 1 week.  Systolic pressure above 200 or diastolic pressure above 120: Begin antihypertensive therapy; follow up with your caregiver within 1 week. Document Released: 09/15/2011 Document Reviewed: 09/15/2011 [facility] Patient Information 2015 [place], [place]. This information is not intended to replace advice given to you by your health care provider. Make sure you discuss any questions you have with your health care provider.  DASH Eating Plan DASH stands for "Dietary Approaches to Stop Hypertension." The DASH eating plan is a healthy eating plan that has been shown to reduce high blood pressure (hypertension). Additional health benefits may include reducing the risk of type 2 diabetes mellitus, heart disease, and stroke. The DASH eating plan may also help with  weight loss. WHAT DO I NEED TO KNOW ABOUT THE DASH EATING PLAN? For the DASH eating plan, you will follow these general guidelines:  Choose foods with a percent daily value for sodium of less than 5% (as listed on the food label).  Use salt-free seasonings or herbs instead of table salt or sea salt.  Check with your health care provider or pharmacist before using salt substitutes.  Eat lower-sodium products, often labeled as "lower sodium" or "no salt added."  Eat fresh foods.  Eat more vegetables, fruits, and low-fat dairy products.  Choose whole grains. Look for the word "whole" as the first word in the ingredient list.  Choose fish and skinless chicken or [place] more often than red meat. Limit fish, poultry, and meat to 6 oz (170 g) each day.  Limit sweets, desserts, sugars, and sugary drinks.  Choose heart-healthy fats.  Limit cheese to 1 oz (28 g) per day.  Eat more home-cooked food and less restaurant, buffet, and fast food.  Limit fried foods.  Cook foods using methods other than frying.  Limit canned vegetables. If you do use them, rinse them well to decrease the sodium.  When eating at a restaurant, ask that your food be prepared with less salt, or no salt if possible. WHAT FOODS CAN I EAT? Seek help from a dietitian for individual calorie needs. Grains Whole grain or whole wheat  bread. Brown rice. Whole grain or whole wheat pasta. Quinoa, bulgur, and whole grain cereals. Low-sodium cereals. Corn or whole wheat flour tortillas. Whole grain cornbread. Whole grain crackers. Low-sodium crackers. Vegetables Fresh or frozen vegetables (raw, steamed, roasted, or grilled). Low-sodium or reduced-sodium tomato and vegetable juices. Low-sodium or reduced-sodium tomato sauce and paste. Low-sodium or reduced-sodium canned vegetables.  Fruits All fresh, canned (in natural juice), or frozen fruits. Meat and Other Protein Products Ground beef (85% or leaner), grass-fed beef, or  beef trimmed of fat. Skinless chicken or [place]. Ground chicken or [place]. Pork trimmed of fat. All fish and seafood. Eggs. Dried beans, peas, or lentils. Unsalted nuts and seeds. Unsalted canned beans. Dairy Low-fat dairy products, such as skim or 1% milk, 2% or reduced-fat cheeses, low-fat ricotta or cottage cheese, or plain low-fat yogurt. Low-sodium or reduced-sodium cheeses. Fats and Oils Tub margarines without trans fats. Light or reduced-fat mayonnaise and salad dressings (reduced sodium). Avocado. Safflower, olive, or canola oils. Natural peanut or almond butter. Other Unsalted popcorn and pretzels. The items listed above may not be a complete list of recommended foods or beverages. Contact your dietitian for more options. WHAT FOODS ARE NOT RECOMMENDED? Grains White bread. White pasta. White rice. Refined cornbread. Bagels and croissants. Crackers that contain trans fat. Vegetables Creamed or fried vegetables. Vegetables in a cheese sauce. Regular canned vegetables. Regular canned tomato sauce and paste. Regular tomato and vegetable juices. Fruits Dried fruits. Canned fruit in light or heavy syrup. Fruit juice. Meat and Other Protein Products Fatty cuts of meat. Ribs, chicken wings, bacon, sausage, bologna, salami, chitterlings, fatback, hot dogs, bratwurst, and packaged luncheon meats. Salted nuts and seeds. Canned beans with salt. Dairy Whole or 2% milk, cream, half-and-half, and cream cheese. Whole-fat or sweetened yogurt. Full-fat cheeses or blue cheese. Nondairy creamers and whipped toppings. Processed cheese, cheese spreads, or cheese curds. Condiments Onion and garlic salt, seasoned salt, table salt, and sea salt. Canned and packaged gravies. Worcestershire sauce. Tartar sauce. Barbecue sauce. Teriyaki sauce. Soy sauce, including reduced sodium. Steak sauce. Fish sauce. Oyster sauce. Cocktail sauce. Horseradish. Ketchup and mustard. Meat flavorings and tenderizers. Bouillon cubes.  Hot sauce. Tabasco sauce. Marinades. Taco seasonings. Relishes. Fats and Oils Butter, stick margarine, lard, shortening, ghee, and bacon fat. Coconut, palm kernel, or palm oils. Regular salad dressings. Other Pickles and olives. Salted popcorn and pretzels. The items listed above may not be a complete list of foods and beverages to avoid. Contact your dietitian for more information. WHERE CAN I FIND MORE INFORMATION? National Heart, Lung, and Blood Institute: [employer] Document Released: 12/10/2010 Document Revised: 05/07/2013 Document Reviewed: 10/25/2012 [facility] Patient Information 2015 [place], [place]. This information is not intended to replace advice given to you by your health care provider. Make sure you discuss any questions you have with your health care provider.

## 2013-12-10 NOTE — Progress Notes (Addendum)
Subjective:  This chart was scribed for Miguel SorensonEva Earlean Fidalgo, MD by Evon Slackerrance Branch, ED Scribe. This Patient was seen in room 03 and the patients care was started at 5:17 PM   Patient ID: Miguel Washington, male    DOB: [birth year], 53 y.o.   MRN: [id number] Chief Complaint  Patient presents with  . Medication Refill    HPI HPI Comments: Miguel BottomsChristopher Washington is a 53 y.o. male who presents to the [facility] for medication refill.   Hospitalized 8/21-8/24 and has not been seen since hospitalization. Unfortunately at that time he was found unresponsive he had hyponatremia and hypokalemia due to acute alcohol intoxication. In addition his LFT's were elevated and platelets due to long standing alcohol abuse.  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   He states that he would like his metoprolol refilled. Pt states that he recently ran out of his BP medication over the past 2-3 days. He states that his BP 141-130/82 when on this medication. He denies any side effects from taking this medication. He states that he is intermittently taking vitamins.   Past Medical History  Diagnosis Date  . Hypertension   . Anxiety    Current Outpatient Prescriptions on File Prior to Visit  Medication Sig Dispense Refill  . folic acid (FOLVITE) 1 MG tablet Take 1 tablet (1 mg total) by mouth daily. 30 tablet 0  . LORazepam (ATIVAN) 0.5 MG tablet Take 1-2 tablets (0.5-1 mg total) by mouth daily. 30 tablet 0  . metoprolol (LOPRESSOR) 100 MG tablet Take 1 tablet (100 mg total) by mouth 2 (two) times daily. 180 tablet 3   No current facility-administered medications on file prior to visit.   No Known Allergies   Review of Systems  Constitutional: Negative for fever and chills.  Eyes: Negative for visual disturbance.  Respiratory: Negative for shortness of breath.     Cardiovascular: Negative for chest pain and leg swelling.  Neurological: Negative for dizziness, syncope, facial asymmetry, weakness, light-headedness and headaches.     Objective:   BP 142/84 mmHg  Pulse 72  Temp(Src) 98.3 F (36.8 C) (Oral)  Resp 16  Ht 5' 6.5" (1.689 m)  Wt 162 lb (73.483 kg)  BMI 25.76 kg/m2  SpO2 98%   Physical Exam  Constitutional: He is oriented to person, place, and time. He appears well-developed and well-nourished. No distress.  HENT:  Head: Normocephalic and atraumatic.  Eyes: Conjunctivae and EOM are normal.  Neck: Neck supple.  Cardiovascular: Normal rate, regular rhythm, S1 normal, S2 normal and normal heart sounds.   No murmur heard. Pulmonary/Chest: Effort normal. No respiratory distress.  Lungs clear to auscultation   Musculoskeletal: Normal range of motion.  Neurological: He is alert and oriented to person, place, and time.  Skin: Skin is warm and dry.  Psychiatric: He has a normal mood and affect. His behavior is normal.  Nursing note and vitals reviewed.    Assessment & Plan:   Essential hypertension  Alcohol dependence with other alcohol-induced disorder  Meds ordered this encounter  Medications  . metoprolol (LOPRESSOR) 100 MG tablet    Sig: Take 1 tablet (100 mg total) by mouth 2 (two) times daily.    Dispense:  180 tablet    Refill:  3  . amLODipine (NORVASC) 2.5 MG tablet    Sig:  Take 1 tablet (2.5 mg total) by mouth daily.    Dispense:  90 tablet    Refill:  3    I personally performed the services described in this documentation, which was scribed in my presence. The recorded information has been reviewed and considered, and addended by me as needed.  Miguel SorensonEva Jaevon Paras, MD MPH

## 2014-12-09 ENCOUNTER — Other Ambulatory Visit: Payer: Self-pay | Admitting: Family Medicine

## 2015-01-12 ENCOUNTER — Other Ambulatory Visit: Payer: Self-pay | Admitting: Physician Assistant

## 2015-01-31 ENCOUNTER — Ambulatory Visit (INDEPENDENT_AMBULATORY_CARE_PROVIDER_SITE_OTHER): Payer: Self-pay | Admitting: Urgent Care

## 2015-01-31 VITALS — BP 142/84 | HR 80 | Temp 97.9°F | Resp 17 | Ht 65.5 in | Wt 150.0 lb

## 2015-01-31 DIAGNOSIS — R059 Cough, unspecified: Secondary | ICD-10-CM

## 2015-01-31 DIAGNOSIS — F1029 Alcohol dependence with unspecified alcohol-induced disorder: Secondary | ICD-10-CM

## 2015-01-31 DIAGNOSIS — J302 Other seasonal allergic rhinitis: Secondary | ICD-10-CM

## 2015-01-31 DIAGNOSIS — I10 Essential (primary) hypertension: Secondary | ICD-10-CM

## 2015-01-31 DIAGNOSIS — R05 Cough: Secondary | ICD-10-CM

## 2015-01-31 DIAGNOSIS — R0981 Nasal congestion: Secondary | ICD-10-CM

## 2015-01-31 MED ORDER — BENZONATATE 100 MG PO CAPS: 100.0000 mg | ORAL_CAPSULE | Freq: Three times a day (TID) | ORAL | Status: DC | PRN

## 2015-01-31 MED ORDER — METOPROLOL TARTRATE 100 MG PO TABS: 100.0000 mg | ORAL_TABLET | Freq: Two times a day (BID) | ORAL | Status: DC

## 2015-01-31 MED ORDER — AZITHROMYCIN 250 MG PO TABS: ORAL_TABLET | ORAL | Status: DC

## 2015-01-31 MED ORDER — AMLODIPINE BESYLATE 2.5 MG PO TABS: 2.5000 mg | ORAL_TABLET | Freq: Every day | ORAL | Status: DC

## 2015-01-31 MED ORDER — CETIRIZINE HCL 10 MG PO TABS: 10.0000 mg | ORAL_TABLET | Freq: Every day | ORAL | Status: DC

## 2015-01-31 MED ORDER — CETIRIZINE HCL 10 MG PO TABS: 10.0000 mg | ORAL_TABLET | Freq: Every day | ORAL | Status: AC

## 2015-01-31 NOTE — Progress Notes (Signed)
MRN: [id number] DOB: 1960-07-22  Subjective:   Miguel Washington is a 55 y.o. male presenting for chief complaint of Medication Refill  HTN - Reports that he needs a medication refill of his lopressor and amlodipine. He is not currently taking his amlodipine for an unknown reason. Patient is a heavy drinkers and states that he has started to cut back. Patient had a DWI recently, has had to see a therapist to help with this. He has otherwise not sought treatment for his alcohol dependence. Patient is also requesting refill of Ativan for "getting shaky" and states that it is for his depression. Denies lightheadedness, dizziness, chronic headache, double vision, chest pain, heart racing, palpitations, nausea, vomiting, abdominal pain, hematuria, lower leg swelling.   URI - Reports 3 week history productive cough, mild shob, sore throat, sinus congestion. He has not tried any medications for relief. Denies fever, night sweats, weight loss. Admits year round allergies but does not take any medications for this. Denies history of asthma. Denies smoking cigarettes.  Maricela has a current medication list which includes the following prescription(s): metoprolol and amlodipine. Also has No Known Allergies.  Binyomin  has a past medical history of Hypertension and Anxiety. Also  has past surgical history that includes Cholecystectomy.  Objective:   Vitals: BP 142/84 mmHg  Pulse 80  Temp(Src) 97.9 F (36.6 C) (Oral)  Resp 17  Ht 5' 5.5" (1.664 m)  Wt 150 lb (68.04 kg)  BMI 24.57 kg/m2  SpO2 98%  Physical Exam  Constitutional: He is oriented to person, place, and time. He appears well-developed and well-nourished.  Patient smells of alcohol.  HENT:  TM's intact bilaterally, no effusions or erythema. Nasal turbinates pink and moist, nasal passages patent. No sinus tenderness. Oropharynx clear, mucous membranes moist, dentition in good repair.  Eyes: Right eye exhibits no discharge.  Left eye exhibits no discharge. Scleral icterus (mild) is present.  Neck: Normal range of motion. Neck supple.  Cardiovascular: Normal rate, regular rhythm and intact distal pulses.  Exam reveals no gallop and no friction rub.   No murmur heard. Pulmonary/Chest: No respiratory distress. He has no wheezes. He has no rales.  Abdominal: Soft. Bowel sounds are normal. He exhibits no distension and no mass. There is no tenderness.  Musculoskeletal: He exhibits no edema.  Lymphadenopathy:    He has no cervical adenopathy.  Neurological: He is alert and oriented to person, place, and time.  Skin: Skin is warm and dry. No rash noted. No erythema. No pallor.   Assessment and Plan :   1. Alcohol dependence with unspecified alcohol-induced disorder (HCC) - Counseled patient on risks of alcohol dependence. Patient verbalized understanding and stated that he will continue to seek treatment through the court appointed therapist. I suggested patient also consider rehab but patient did not seem to interested in this. I declined filling any benzo for patient given his current alcohol use and synergistic effects. Patient agreed.  2. Essential hypertension - Stable, refilled BP medications. His HTN is likely related to his alcohol use and counseled patient on this. Verbalized understanding. Patient refused blood work due to cost burden but agreed to try and return for labs only within the next 3 months. Refills provided for 6 months.  3. Cough 4. Nasal congestion - Likely residual symptoms from viral URI, worsened by uncontrolled allergies  5. Seasonal allergies - Start Zyrtec for management of allergies.  Wallis Bamberg, PA-C [facility] [phone] 01/31/2015  4:47 PM

## 2015-01-31 NOTE — Patient Instructions (Addendum)
Alcohol Use Disorder Alcohol use disorder is a mental disorder. It is not a one-time incident of heavy drinking. Alcohol use disorder is the excessive and uncontrollable use of alcohol over time that leads to problems with functioning in one or more areas of daily living. People with this disorder risk harming themselves and others when they drink to excess. Alcohol use disorder also can cause other mental disorders, such as mood and anxiety disorders, and serious physical problems. People with alcohol use disorder often misuse other drugs.  Alcohol use disorder is common and widespread. Some people with this disorder drink alcohol to cope with or escape from negative life events. Others drink to relieve chronic pain or symptoms of mental illness. People with a family history of alcohol use disorder are at higher risk of losing control and using alcohol to excess.  Drinking too much alcohol can cause injury, accidents, and health problems. One drink can be too much when you are:  Working.  Pregnant or breastfeeding.  Taking medicines. Ask your doctor.  Driving or planning to drive. SYMPTOMS  Signs and symptoms of alcohol use disorder may include the following:   Consumption ofalcohol inlarger amounts or over a longer period of time than intended.  Multiple unsuccessful attempts to cutdown or control alcohol use.   A great deal of time spent obtaining alcohol, using alcohol, or recovering from the effects of alcohol (hangover).  A strong desire or urge to use alcohol (cravings).   Continued use of alcohol despite problems at work, school, or home because of alcohol use.   Continued use of alcohol despite problems in relationships because of alcohol use.  Continued use of alcohol in situations when it is physically hazardous, such as driving a car.  Continued use of alcohol despite awareness of a physical or psychological problem that is likely related to alcohol use. Physical  problems related to alcohol use can involve the brain, heart, liver, stomach, and intestines. Psychological problems related to alcohol use include intoxication, depression, anxiety, psychosis, delirium, and dementia.   The need for increased amounts of alcohol to achieve the same desired effect, or a decreased effect from the consumption of the same amount of alcohol (tolerance).  Withdrawal symptoms upon reducing or stopping alcohol use, or alcohol use to reduce or avoid withdrawal symptoms. Withdrawal symptoms include:  Racing heart.  Hand tremor.  Difficulty sleeping.  Nausea.  Vomiting.  Hallucinations.  Restlessness.  Seizures. DIAGNOSIS Alcohol use disorder is diagnosed through an assessment by your health care provider. Your health care provider may start by asking three or four questions to screen for excessive or problematic alcohol use. To confirm a diagnosis of alcohol use disorder, at least two symptoms must be present within a 47-monthperiod. The severity of alcohol use disorder depends on the number of symptoms:  Mild--two or three.  Moderate--four or five.  Severe--six or more. Your health care provider may perform a physical exam or use results from lab tests to see if you have physical problems resulting from alcohol use. Your health care provider may refer you to a mental health professional for evaluation. TREATMENT  Some people with alcohol use disorder are able to reduce their alcohol use to low-risk levels. Some people with alcohol use disorder need to quit drinking alcohol. When necessary, mental health professionals with specialized training in substance use treatment can help. Your health care provider can help you decide how severe your alcohol use disorder is and what type of treatment you need.  The following forms of treatment are available:   Detoxification. Detoxification involves the use of prescription medicines to prevent alcohol withdrawal  symptoms in the first week after quitting. This is important for people with a history of symptoms of withdrawal and for heavy drinkers who are likely to have withdrawal symptoms. Alcohol withdrawal can be dangerous and, in severe cases, cause death. Detoxification is usually provided in a hospital or in-patient substance use treatment facility.  Counseling or talk therapy. Talk therapy is provided by substance use treatment counselors. It addresses the reasons people use alcohol and ways to keep them from drinking again. The goals of talk therapy are to help people with alcohol use disorder find healthy activities and ways to cope with life stress, to identify and avoid triggers for alcohol use, and to handle cravings, which can cause relapse.  Medicines.Different medicines can help treat alcohol use disorder through the following actions:  Decrease alcohol cravings.  Decrease the positive reward response felt from alcohol use.  Produce an uncomfortable physical reaction when alcohol is used (aversion therapy).  Support groups. Support groups are run by people who have quit drinking. They provide emotional support, advice, and guidance. These forms of treatment are often combined. Some people with alcohol use disorder benefit from intensive combination treatment provided by specialized substance use treatment centers. Both inpatient and outpatient treatment programs are available.   This information is not intended to replace advice given to you by your health care provider. Make sure you discuss any questions you have with your health care provider.   Document Released: 01/29/2004 Document Revised: 01/11/2014 Document Reviewed: 03/30/2012 Elsevier Interactive Patient Education 2016 [employer].    Hypertension Hypertension, commonly called high blood pressure, is when the force of blood pumping through your arteries is too strong. Your arteries are the blood vessels that carry blood from  your heart throughout your body. A blood pressure reading consists of a higher number over a lower number, such as 110/72. The higher number (systolic) is the pressure inside your arteries when your heart pumps. The lower number (diastolic) is the pressure inside your arteries when your heart relaxes. Ideally you want your blood pressure below 120/80. Hypertension forces your heart to work harder to pump blood. Your arteries may become narrow or stiff. Having untreated or uncontrolled hypertension can cause heart attack, stroke, kidney disease, and other problems. RISK FACTORS Some risk factors for high blood pressure are controllable. Others are not.  Risk factors you cannot control include:   Race. You may be at higher risk if you are African American.  Age. Risk increases with age.  Gender. Men are at higher risk than women before age 64 years. After age 2, women are at higher risk than men. Risk factors you can control include:  Not getting enough exercise or physical activity.  Being overweight.  Getting too much fat, sugar, calories, or salt in your diet.  Drinking too much alcohol. SIGNS AND SYMPTOMS Hypertension does not usually cause signs or symptoms. Extremely high blood pressure (hypertensive crisis) may cause headache, anxiety, shortness of breath, and nosebleed. DIAGNOSIS To check if you have hypertension, your health care provider will measure your blood pressure while you are seated, with your arm held at the level of your heart. It should be measured at least twice using the same arm. Certain conditions can cause a difference in blood pressure between your right and left arms. A blood pressure reading that is higher than normal on one occasion  does not mean that you need treatment. If it is not clear whether you have high blood pressure, you may be asked to return on a different day to have your blood pressure checked again. Or, you may be asked to monitor your blood pressure  at home for 1 or more weeks. TREATMENT Treating high blood pressure includes making lifestyle changes and possibly taking medicine. Living a healthy lifestyle can help lower high blood pressure. You may need to change some of your habits. Lifestyle changes may include:  Following the DASH diet. This diet is high in fruits, vegetables, and whole grains. It is low in salt, red meat, and added sugars.  Keep your sodium intake below 2,300 mg per day.  Getting at least 30-45 minutes of aerobic exercise at least 4 times per week.  Losing weight if necessary.  Not smoking.  Limiting alcoholic beverages.  Learning ways to reduce stress. Your health care provider may prescribe medicine if lifestyle changes are not enough to get your blood pressure under control, and if one of the following is true:  You are 17-61 years of age and your systolic blood pressure is above 140.  You are 48 years of age or older, and your systolic blood pressure is above 150.  Your diastolic blood pressure is above 90.  You have diabetes, and your systolic blood pressure is over 140 or your diastolic blood pressure is over 90.  You have kidney disease and your blood pressure is above 140/90.  You have heart disease and your blood pressure is above 140/90. Your personal target blood pressure may vary depending on your medical conditions, your age, and other factors. HOME CARE INSTRUCTIONS  Have your blood pressure rechecked as directed by your health care provider.   Take medicines only as directed by your health care provider. Follow the directions carefully. Blood pressure medicines must be taken as prescribed. The medicine does not work as well when you skip doses. Skipping doses also puts you at risk for problems.  Do not smoke.   Monitor your blood pressure at home as directed by your health care provider. SEEK MEDICAL CARE IF:   You think you are having a reaction to medicines taken.  You have  recurrent headaches or feel dizzy.  You have swelling in your ankles.  You have trouble with your vision. SEEK IMMEDIATE MEDICAL CARE IF:  You develop a severe headache or confusion.  You have unusual weakness, numbness, or feel faint.  You have severe chest or abdominal pain.  You vomit repeatedly.  You have trouble breathing. MAKE SURE YOU:   Understand these instructions.  Will watch your condition.  Will get help right away if you are not doing well or get worse.   This information is not intended to replace advice given to you by your health care provider. Make sure you discuss any questions you have with your health care provider.   Document Released: 12/21/2004 Document Revised: 05/07/2014 Document Reviewed: 10/13/2012 Elsevier Interactive Patient Education 2016 [employer] Inc[employer]    Cough, Adult Coughing is a reflex that clears your throat and your airways. Coughing helps to heal and protect your lungs. It is normal to cough occasionally, but a cough that happens with other symptoms or lasts a long time may be a sign of a condition that needs treatment. A cough may last only 2-3 weeks (acute), or it may last longer than 8 weeks (chronic). CAUSES Coughing is commonly caused by:  Breathing in substances  that irritate your lungs.  A viral or bacterial respiratory infection.  Allergies.  Asthma.  Postnasal drip.  Smoking.  Acid backing up from the stomach into the esophagus (gastroesophageal reflux).  Certain medicines.  Chronic lung problems, including COPD (or rarely, lung cancer).  Other medical conditions such as heart failure. HOME CARE INSTRUCTIONS  Pay attention to any changes in your symptoms. Take these actions to help with your discomfort:  Take medicines only as told by your health care provider.  If you were prescribed an antibiotic medicine, take it as told by your health care provider. Do not stop taking the antibiotic even if you start to  feel better.  Talk with your health care provider before you take a cough suppressant medicine.  Drink enough fluid to keep your urine clear or pale yellow.  If the air is dry, use a cold steam vaporizer or humidifier in your bedroom or your home to help loosen secretions.  Avoid anything that causes you to cough at work or at home.  If your cough is worse at night, try sleeping in a semi-upright position.  Avoid cigarette smoke. If you smoke, quit smoking. If you need help quitting, ask your health care provider.  Avoid caffeine.  Avoid alcohol.  Rest as needed. SEEK MEDICAL CARE IF:   You have new symptoms.  You cough up pus.  Your cough does not get better after 2-3 weeks, or your cough gets worse.  You cannot control your cough with suppressant medicines and you are losing sleep.  You develop pain that is getting worse or pain that is not controlled with pain medicines.  You have a fever.  You have unexplained weight loss.  You have night sweats. SEEK IMMEDIATE MEDICAL CARE IF:  You cough up blood.  You have difficulty breathing.  Your heartbeat is very fast.   This information is not intended to replace advice given to you by your health care provider. Make sure you discuss any questions you have with your health care provider.   Document Released: 06/19/2010 Document Revised: 09/11/2014 Document Reviewed: 02/27/2014 Elsevier Interactive Patient Education [employer]

## 2015-06-16 ENCOUNTER — Encounter (HOSPITAL_COMMUNITY): Payer: Self-pay | Admitting: Emergency Medicine

## 2015-06-16 DIAGNOSIS — K7011 Alcoholic hepatitis with ascites: Principal | ICD-10-CM | POA: Diagnosis present

## 2015-06-16 DIAGNOSIS — F419 Anxiety disorder, unspecified: Secondary | ICD-10-CM | POA: Diagnosis present

## 2015-06-16 DIAGNOSIS — Y903 Blood alcohol level of 60-79 mg/100 ml: Secondary | ICD-10-CM | POA: Diagnosis present

## 2015-06-16 DIAGNOSIS — I4581 Long QT syndrome: Secondary | ICD-10-CM | POA: Diagnosis present

## 2015-06-16 DIAGNOSIS — Z79899 Other long term (current) drug therapy: Secondary | ICD-10-CM

## 2015-06-16 DIAGNOSIS — Z6824 Body mass index (BMI) 24.0-24.9, adult: Secondary | ICD-10-CM

## 2015-06-16 DIAGNOSIS — T502X5A Adverse effect of carbonic-anhydrase inhibitors, benzothiadiazides and other diuretics, initial encounter: Secondary | ICD-10-CM | POA: Diagnosis present

## 2015-06-16 DIAGNOSIS — K760 Fatty (change of) liver, not elsewhere classified: Secondary | ICD-10-CM | POA: Diagnosis present

## 2015-06-16 DIAGNOSIS — E440 Moderate protein-calorie malnutrition: Secondary | ICD-10-CM | POA: Diagnosis present

## 2015-06-16 DIAGNOSIS — E871 Hypo-osmolality and hyponatremia: Secondary | ICD-10-CM | POA: Diagnosis present

## 2015-06-16 DIAGNOSIS — E162 Hypoglycemia, unspecified: Secondary | ICD-10-CM | POA: Diagnosis present

## 2015-06-16 DIAGNOSIS — Z803 Family history of malignant neoplasm of breast: Secondary | ICD-10-CM

## 2015-06-16 DIAGNOSIS — I959 Hypotension, unspecified: Secondary | ICD-10-CM | POA: Diagnosis present

## 2015-06-16 DIAGNOSIS — E7220 Disorder of urea cycle metabolism, unspecified: Secondary | ICD-10-CM | POA: Diagnosis present

## 2015-06-16 DIAGNOSIS — K766 Portal hypertension: Secondary | ICD-10-CM | POA: Diagnosis present

## 2015-06-16 DIAGNOSIS — G312 Degeneration of nervous system due to alcohol: Secondary | ICD-10-CM | POA: Diagnosis present

## 2015-06-16 DIAGNOSIS — E876 Hypokalemia: Secondary | ICD-10-CM | POA: Diagnosis present

## 2015-06-16 DIAGNOSIS — E261 Secondary hyperaldosteronism: Secondary | ICD-10-CM | POA: Diagnosis present

## 2015-06-16 DIAGNOSIS — K652 Spontaneous bacterial peritonitis: Secondary | ICD-10-CM | POA: Diagnosis present

## 2015-06-16 DIAGNOSIS — Z8249 Family history of ischemic heart disease and other diseases of the circulatory system: Secondary | ICD-10-CM

## 2015-06-16 DIAGNOSIS — I10 Essential (primary) hypertension: Secondary | ICD-10-CM | POA: Diagnosis present

## 2015-06-16 DIAGNOSIS — Z833 Family history of diabetes mellitus: Secondary | ICD-10-CM

## 2015-06-16 DIAGNOSIS — Z9049 Acquired absence of other specified parts of digestive tract: Secondary | ICD-10-CM

## 2015-06-16 DIAGNOSIS — F10229 Alcohol dependence with intoxication, unspecified: Secondary | ICD-10-CM | POA: Diagnosis present

## 2015-06-16 DIAGNOSIS — K7290 Hepatic failure, unspecified without coma: Secondary | ICD-10-CM | POA: Diagnosis present

## 2015-06-16 NOTE — ED Notes (Signed)
Patient presents from home via EMS for generalized weakness and ETOH detox. Endorses ETOH today.   Last VS: 118/74, 72hr, [id number], 97%ra

## 2015-06-17 ENCOUNTER — Encounter (HOSPITAL_COMMUNITY): Payer: Self-pay | Admitting: Emergency Medicine

## 2015-06-17 ENCOUNTER — Inpatient Hospital Stay (HOSPITAL_COMMUNITY): Payer: Self-pay

## 2015-06-17 ENCOUNTER — Inpatient Hospital Stay (HOSPITAL_COMMUNITY): Payer: MEDICAID

## 2015-06-17 ENCOUNTER — Emergency Department (HOSPITAL_COMMUNITY): Payer: Self-pay

## 2015-06-17 ENCOUNTER — Inpatient Hospital Stay (HOSPITAL_COMMUNITY)
Admission: EM | Admit: 2015-06-17 | Discharge: 2015-06-30 | DRG: 432 | Disposition: A | Discharge status: Home Health | Payer: Self-pay | Attending: Internal Medicine | Admitting: Internal Medicine

## 2015-06-17 DIAGNOSIS — R7989 Other specified abnormal findings of blood chemistry: Secondary | ICD-10-CM

## 2015-06-17 DIAGNOSIS — E7220 Disorder of urea cycle metabolism, unspecified: Secondary | ICD-10-CM | POA: Diagnosis present

## 2015-06-17 DIAGNOSIS — E871 Hypo-osmolality and hyponatremia: Secondary | ICD-10-CM | POA: Diagnosis present

## 2015-06-17 DIAGNOSIS — E440 Moderate protein-calorie malnutrition: Secondary | ICD-10-CM | POA: Diagnosis present

## 2015-06-17 DIAGNOSIS — R17 Unspecified jaundice: Secondary | ICD-10-CM

## 2015-06-17 DIAGNOSIS — I10 Essential (primary) hypertension: Secondary | ICD-10-CM | POA: Diagnosis present

## 2015-06-17 DIAGNOSIS — F1010 Alcohol abuse, uncomplicated: Secondary | ICD-10-CM

## 2015-06-17 DIAGNOSIS — I4581 Long QT syndrome: Secondary | ICD-10-CM

## 2015-06-17 DIAGNOSIS — E876 Hypokalemia: Secondary | ICD-10-CM | POA: Diagnosis present

## 2015-06-17 DIAGNOSIS — K709 Alcoholic liver disease, unspecified: Secondary | ICD-10-CM

## 2015-06-17 DIAGNOSIS — F10120 Alcohol abuse with intoxication, uncomplicated: Secondary | ICD-10-CM

## 2015-06-17 DIAGNOSIS — F10929 Alcohol use, unspecified with intoxication, unspecified: Secondary | ICD-10-CM | POA: Diagnosis present

## 2015-06-17 DIAGNOSIS — E860 Dehydration: Secondary | ICD-10-CM

## 2015-06-17 DIAGNOSIS — R945 Abnormal results of liver function studies: Secondary | ICD-10-CM | POA: Diagnosis present

## 2015-06-17 DIAGNOSIS — R9431 Abnormal electrocardiogram [ECG] [EKG]: Secondary | ICD-10-CM | POA: Diagnosis present

## 2015-06-17 DIAGNOSIS — K7460 Unspecified cirrhosis of liver: Secondary | ICD-10-CM

## 2015-06-17 LAB — COMPREHENSIVE METABOLIC PANEL
ALK PHOS: 267 U/L — AB (ref 38–126)
ALT: 38 U/L (ref 17–63)
AST: 177 U/L — ABNORMAL HIGH (ref 15–41)
Albumin: 2.1 g/dL — ABNORMAL LOW (ref 3.5–5.0)
Anion gap: 19 — ABNORMAL HIGH (ref 5–15)
BUN: 9 mg/dL (ref 6–20)
CALCIUM: 7.8 mg/dL — AB (ref 8.9–10.3)
CO2: 23 mmol/L (ref 22–32)
CREATININE: 0.56 mg/dL — AB (ref 0.61–1.24)
Chloride: 86 mmol/L — ABNORMAL LOW (ref 101–111)
GFR calc non Af Amer: >60 mL/min (ref >60)
GLUCOSE: 61 mg/dL — AB (ref 65–99)
Potassium: 2.7 mmol/L — CL (ref 3.5–5.1)
SODIUM: 128 mmol/L — AB (ref 135–145)
Total Bilirubin: 20.4 mg/dL (ref 0.3–1.2)
Total Protein: 6.9 g/dL (ref 6.5–8.1)

## 2015-06-17 LAB — GLUCOSE, CAPILLARY
GLUCOSE-CAPILLARY: 103 mg/dL — AB (ref 65–99)
Glucose-Capillary: 55 mg/dL — ABNORMAL LOW (ref 65–99)
Glucose-Capillary: 104 mg/dL — ABNORMAL HIGH (ref 65–99)
Glucose-Capillary: 130 mg/dL — ABNORMAL HIGH (ref 65–99)

## 2015-06-17 LAB — CBC
HCT: 36.2 % — ABNORMAL LOW (ref 39.0–52.0)
HEMATOCRIT: 37.2 % — AB (ref 39.0–52.0)
HEMOGLOBIN: 13.2 g/dL (ref 13.0–17.0)
Hemoglobin: 13.2 g/dL (ref 13.0–17.0)
MCH: 30.6 pg (ref 26.0–34.0)
MCH: 30.6 pg (ref 26.0–34.0)
MCHC: 35.5 g/dL (ref 30.0–36.0)
MCHC: 36.5 g/dL — AB (ref 30.0–36.0)
MCV: 84 fL (ref 78.0–100.0)
MCV: 86.1 fL (ref 78.0–100.0)
PLATELETS: 111 10*3/uL — AB (ref 150–400)
Platelets: 99 10*3/uL — ABNORMAL LOW (ref 150–400)
RBC: 4.31 MIL/uL (ref 4.22–5.81)
RBC: 4.32 MIL/uL (ref 4.22–5.81)
RDW: 19.3 % — ABNORMAL HIGH (ref 11.5–15.5)
RDW: 19.6 % — AB (ref 11.5–15.5)
WBC: 8 10*3/uL (ref 4.0–10.5)
WBC: 9.2 10*3/uL (ref 4.0–10.5)

## 2015-06-17 LAB — ETHANOL: Alcohol, Ethyl (B): 79 mg/dL — ABNORMAL HIGH (ref <5)

## 2015-06-17 LAB — URINALYSIS, ROUTINE W REFLEX MICROSCOPIC
Glucose, UA: NEGATIVE mg/dL
HGB URINE DIPSTICK: NEGATIVE
KETONES UR: 40 mg/dL — AB
Leukocytes, UA: NEGATIVE
Nitrite: NEGATIVE
PROTEIN: NEGATIVE mg/dL
Specific Gravity, Urine: 1.014 (ref 1.005–1.030)
pH: 6.5 (ref 5.0–8.0)

## 2015-06-17 LAB — BASIC METABOLIC PANEL
Anion gap: 20 — ABNORMAL HIGH (ref 5–15)
BUN: 9 mg/dL (ref 6–20)
CALCIUM: 7.5 mg/dL — AB (ref 8.9–10.3)
CHLORIDE: 93 mmol/L — AB (ref 101–111)
CO2: 19 mmol/L — AB (ref 22–32)
Creatinine, Ser: 0.61 mg/dL (ref 0.61–1.24)
Glucose, Bld: 50 mg/dL — ABNORMAL LOW (ref 65–99)
Potassium: 3.7 mmol/L (ref 3.5–5.1)
Sodium: 132 mmol/L — ABNORMAL LOW (ref 135–145)

## 2015-06-17 LAB — LIPASE, BLOOD: Lipase: 76 U/L — ABNORMAL HIGH (ref 11–51)

## 2015-06-17 LAB — PROTIME-INR
INR: 1.72 — AB (ref 0.00–1.49)
PROTHROMBIN TIME: 20.2 s — AB (ref 11.6–15.2)

## 2015-06-17 LAB — MAGNESIUM: Magnesium: 1.8 mg/dL (ref 1.7–2.4)

## 2015-06-17 LAB — AMMONIA: AMMONIA: 82 umol/L — AB (ref 9–35)

## 2015-06-17 MED ORDER — FOLIC ACID 1 MG PO TABS: 1.0000 mg | ORAL_TABLET | Freq: Every day | ORAL | Status: DC

## 2015-06-17 MED ORDER — LORAZEPAM 2 MG/ML IJ SOLN
2.0000 mg | INTRAMUSCULAR | Status: DC | PRN
  Administered 2015-06-17 – 2015-06-18 (×4): 2 mg via INTRAVENOUS
  Filled 2015-06-17 (×4): qty 1

## 2015-06-17 MED ORDER — LACTULOSE 10 GM/15ML PO SOLN
30.0000 g | Freq: Two times a day (BID) | ORAL | Status: DC
  Administered 2015-06-17 (×2): 30 g via ORAL
  Filled 2015-06-17 (×2): qty 45
  Filled 2015-06-17: qty 60
  Filled 2015-06-17: qty 45

## 2015-06-17 MED ORDER — PHYTONADIONE 5 MG PO TABS: 2.5000 mg | ORAL_TABLET | Freq: Once | ORAL | Status: DC

## 2015-06-17 MED ORDER — POTASSIUM CHLORIDE CRYS ER 20 MEQ PO TBCR
40.0000 meq | EXTENDED_RELEASE_TABLET | Freq: Once | ORAL | Status: AC
  Administered 2015-06-17: 40 meq via ORAL
  Filled 2015-06-17: qty 2

## 2015-06-17 MED ORDER — ADULT MULTIVITAMIN W/MINERALS CH
1.0000 | ORAL_TABLET | Freq: Every day | ORAL | Status: DC
  Administered 2015-06-21 – 2015-06-30 (×10): 1 via ORAL
  Filled 2015-06-17 (×12): qty 1

## 2015-06-17 MED ORDER — THIAMINE HCL 100 MG/ML IJ SOLN
Freq: Once | INTRAVENOUS | Status: AC
  Administered 2015-06-17: 02:00:00 via INTRAVENOUS
  Filled 2015-06-17: qty 1000

## 2015-06-17 MED ORDER — FOLIC ACID 1 MG PO TABS
1.0000 mg | ORAL_TABLET | Freq: Every day | ORAL | Status: DC
  Administered 2015-06-21 – 2015-06-30 (×10): 1 mg via ORAL
  Filled 2015-06-17 (×12): qty 1

## 2015-06-17 MED ORDER — DEXTROSE-NACL 5-0.9 % IV SOLN
INTRAVENOUS | Status: AC
  Administered 2015-06-17 – 2015-06-20 (×6): via INTRAVENOUS

## 2015-06-17 MED ORDER — TRAZODONE HCL 50 MG PO TABS
25.0000 mg | ORAL_TABLET | Freq: Every evening | ORAL | Status: DC | PRN
  Administered 2015-06-24 – 2015-06-29 (×2): 25 mg via ORAL
  Filled 2015-06-17 (×2): qty 1

## 2015-06-17 MED ORDER — SODIUM CHLORIDE 0.9 % IV SOLN
INTRAVENOUS | Status: DC
  Administered 2015-06-17: 06:00:00 via INTRAVENOUS

## 2015-06-17 MED ORDER — ENSURE ENLIVE PO LIQD
237.0000 mL | Freq: Two times a day (BID) | ORAL | Status: DC
  Administered 2015-06-17 – 2015-06-30 (×13): 237 mL via ORAL

## 2015-06-17 MED ORDER — DEXTROSE 50 % IV SOLN
INTRAVENOUS | Status: AC
  Administered 2015-06-17: 25 mL
  Filled 2015-06-17: qty 50

## 2015-06-17 MED ORDER — POTASSIUM CHLORIDE 10 MEQ/100ML IV SOLN
10.0000 meq | INTRAVENOUS | Status: AC
  Administered 2015-06-17 (×3): 10 meq via INTRAVENOUS
  Filled 2015-06-17 (×3): qty 100

## 2015-06-17 MED ORDER — LACTULOSE 10 GM/15ML PO SOLN
30.0000 g | Freq: Three times a day (TID) | ORAL | Status: DC
  Administered 2015-06-17 (×2): 30 g via ORAL
  Filled 2015-06-17 (×3): qty 60

## 2015-06-17 MED ORDER — LORATADINE 10 MG PO TABS
10.0000 mg | ORAL_TABLET | Freq: Every day | ORAL | Status: DC
  Administered 2015-06-21 – 2015-06-30 (×10): 10 mg via ORAL
  Filled 2015-06-17 (×12): qty 1

## 2015-06-17 MED ORDER — VITAMIN B-1 100 MG PO TABS
100.0000 mg | ORAL_TABLET | Freq: Every day | ORAL | Status: DC
  Administered 2015-06-21 – 2015-06-30 (×10): 100 mg via ORAL
  Filled 2015-06-17 (×12): qty 1

## 2015-06-17 MED ORDER — OXYCODONE HCL 5 MG PO TABS: 5.0000 mg | ORAL_TABLET | ORAL | Status: DC | PRN

## 2015-06-17 MED ORDER — CALCIUM CARBONATE ANTACID 500 MG PO CHEW
1.0000 | CHEWABLE_TABLET | Freq: Once | ORAL | Status: DC
  Filled 2015-06-17: qty 1

## 2015-06-17 MED ORDER — SODIUM CHLORIDE 0.9 % IV BOLUS (SEPSIS)
1000.0000 mL | Freq: Once | INTRAVENOUS | Status: AC
  Administered 2015-06-17: 1000 mL via INTRAVENOUS

## 2015-06-17 MED ORDER — SODIUM CHLORIDE 0.9% FLUSH
3.0000 mL | Freq: Two times a day (BID) | INTRAVENOUS | Status: DC
  Administered 2015-06-17 – 2015-06-30 (×22): 3 mL via INTRAVENOUS

## 2015-06-17 MED ORDER — GLUCOSE 40 % PO GEL
ORAL | Status: AC
  Filled 2015-06-17: qty 1

## 2015-06-17 NOTE — ED Provider Notes (Signed)
CSN: [id number]     Arrival date & time 06/16/15  2354 History  By signing my name below, I, Jasmyn B. Alexander, attest that this documentation has been prepared under the direction and in the presence of Torianna Junio, MD.  Electronically Signed: Gillis Ends. Lyn Hollingshead, ED Scribe. 06/17/2015. 1:07 AM.     Chief Complaint  Patient presents with  . Fatigue   Patient is a 55 y.o. male presenting with intoxication. The history is provided by the patient. No language interpreter was used.  Alcohol Intoxication This is a chronic problem. The current episode started more than 1 week ago. The problem occurs constantly. The problem has not changed since onset.Pertinent negatives include no chest pain, no abdominal pain, no headaches and no shortness of breath. Nothing aggravates the symptoms. Nothing relieves the symptoms. He has tried nothing for the symptoms. The treatment provided no relief.    HPI Comments: Miguel Washington is a 55 y.o. male brought in by ambulance, who presents to the Emergency Department complaining of generalized weakness due to ETOH detox x 3 days. He reports that he drinks about 8 mixed drinks a day with Vodka and Ginger Ale, while admitting having a drink PTA. Pt has associated light-headedness and constipation. Pt states he has had sclera icterus x 6 months due to chronic ETOH consumption. He has not had a normal nutritional diet within the past 2 months because he claims he cannot keep anything down. He is compliant with blood pressure medicine. Denies any hx of smoking. Denies any vomiting or diarrhea.  Past Medical History  Diagnosis Date  . Hypertension   . Anxiety    Past Surgical History  Procedure Laterality Date  . Cholecystectomy     Family History  Problem Relation Age of Onset  . Cancer Mother     Breast cancer; brain metastasis  . Diabetes Mother   . Heart disease Father 41    AMI x 2.  . Hypertension Father   . Hypertension Sister   . Hypertension  Sister   . Diabetes Sister    Social History  Substance Use Topics  . Smoking status: Never Smoker   . Smokeless tobacco: None  . Alcohol Use: 12.6 oz/week    21 Shots of liquor per week    Review of Systems  Constitutional: Positive for fatigue. Negative for fever.  Respiratory: Negative for shortness of breath.   Cardiovascular: Negative for chest pain.  Gastrointestinal: Positive for nausea and constipation. Negative for vomiting, abdominal pain and diarrhea.  Neurological: Negative for headaches.  All other systems reviewed and are negative.  Allergies  Review of patient's allergies indicates no known allergies.  Home Medications   Prior to Admission medications   Medication Sig Start Date End Date Taking? Authorizing Provider  amLODipine (NORVASC) 2.5 MG tablet Take 1 tablet (2.5 mg total) by mouth daily. 01/31/15   Wallis Bamberg, PA-C  azithromycin (ZITHROMAX) 250 MG tablet Start with 2 tablets today, then 1 daily thereafter. 01/31/15   Wallis Bamberg, PA-C  benzonatate (TESSALON) 100 MG capsule Take 1-2 capsules (100-200 mg total) by mouth 3 (three) times daily as needed for cough. 01/31/15   Wallis Bamberg, PA-C  cetirizine (ZYRTEC) 10 MG tablet Take 1 tablet (10 mg total) by mouth daily. 01/31/15   Wallis Bamberg, PA-C  metoprolol (LOPRESSOR) 100 MG tablet Take 1 tablet (100 mg total) by mouth 2 (two) times daily. 01/31/15   Wallis Bamberg, PA-C   BP 116/71 mmHg  Pulse 70  Temp(Src) 98 F (36.7 C) (Oral)  Resp 16  SpO2 100% Physical Exam  Constitutional: He is oriented to person, place, and time. He appears well-developed and well-nourished. No distress.  HENT:  Head: Normocephalic and atraumatic.  Mouth/Throat: Oropharynx is clear and moist. No oropharyngeal exudate.  Moist mucous membranes   Eyes: EOM are normal. Pupils are equal, round, and reactive to light. Scleral icterus is present.  Neck: Normal range of motion. Neck supple. No JVD present.  Trachea midline No bruit   Cardiovascular: Normal rate, regular rhythm and normal heart sounds.   Pulmonary/Chest: Effort normal and breath sounds normal. No stridor. No respiratory distress. He has no wheezes. He has no rales.  Abdominal: Soft. Bowel sounds are normal. He exhibits no distension. There is no tenderness. There is no rebound and no guarding.  Ascites.  Musculoskeletal: Normal range of motion. He exhibits no edema or tenderness.  Neurological: He is alert and oriented to person, place, and time. He has normal reflexes.  Liver flap.  Skin: Skin is warm and dry. Nails show clubbing.  Jaundice  Psychiatric: He has a normal mood and affect. His behavior is normal.  Nursing note and vitals reviewed.   ED Course  Procedures (including critical care time) DIAGNOSTIC STUDIES: Oxygen Saturation is 100% on RA, normal by my interpretation.    COORDINATION OF CARE: 12:45 AM-Discussed treatment plan which includes Lipase, CBC panel, CMP, and UA with pt at bedside and pt agreed to plan.   Labs Review Labs Reviewed  CBC - Abnormal; Notable for the following:    HCT 36.2 (*)    MCHC 36.5 (*)    RDW 19.6 (*)    All other components within normal limits  LIPASE, BLOOD  COMPREHENSIVE METABOLIC PANEL  URINALYSIS, ROUTINE W REFLEX MICROSCOPIC (NOT AT [facility])    Imaging Review No results found. I have personally reviewed and evaluated these images and lab results as part of my medical decision-making.   EKG Interpretation None      EKG Interpretation  Date/Time:  Tuesday June 17 2015 01:41:34 EDT Ventricular Rate:  80 PR Interval:  161 QRS Duration: 90 QT Interval:  438 QTC Calculation: 505 R Axis:   16 Text Interpretation:  Sinus rhythm Abnormal R-wave progression, early transition Prolonged QT interval Confirmed by [facility]  MD, Kailei Cowens ([id number]) on 06/17/2015 1:46:45 AM      Filed Vitals:   06/17/15 0030 06/17/15 0037  BP: 116/71 116/71  Pulse: 78 70  Temp:    Resp:  16   Results for  orders placed or performed during the hospital encounter of 06/17/15  Lipase, blood  Result Value Ref Range   Lipase 76 (H) 11 - 51 U/L  Comprehensive metabolic panel  Result Value Ref Range   Sodium 128 (L) 135 - 145 mmol/L   Potassium 2.7 (LL) 3.5 - 5.1 mmol/L   Chloride 86 (L) 101 - 111 mmol/L   CO2 23 22 - 32 mmol/L   Glucose, Bld 61 (L) 65 - 99 mg/dL   BUN 9 6 - 20 mg/dL   Creatinine, Ser 6.210.56 (L) 0.61 - 1.24 mg/dL   Calcium 7.8 (L) 8.9 - 10.3 mg/dL   Total Protein 6.9 6.5 - 8.1 g/dL   Albumin 2.1 (L) 3.5 - 5.0 g/dL   AST 308177 (H) 15 - 41 U/L   ALT 38 17 - 63 U/L   Alkaline Phosphatase 267 (H) 38 - 126 U/L   Total Bilirubin 20.4 (HH) 0.3 - 1.2  mg/dL   GFR calc non Af Amer >60 >60 mL/min   GFR calc Af Amer >60 >60 mL/min   Anion gap 19 (H) 5 - 15  CBC  Result Value Ref Range   WBC 8.0 4.0 - 10.5 K/uL   RBC 4.31 4.22 - 5.81 MIL/uL   Hemoglobin 13.2 13.0 - 17.0 g/dL   HCT 60.4 (L) 54.0 - 98.1 %   MCV 84.0 78.0 - 100.0 fL   MCH 30.6 26.0 - 34.0 pg   MCHC 36.5 (H) 30.0 - 36.0 g/dL   RDW 19.1 (H) 47.8 - 29.5 %   Platelets 111 (L) 150 - 400 K/uL  Ammonia  Result Value Ref Range   Ammonia 82 (H) 9 - 35 umol/L  Magnesium  Result Value Ref Range   Magnesium 1.8 1.7 - 2.4 mg/dL   No results found.  Medications  sodium chloride 0.9 % bolus 1,000 mL (not administered)  potassium chloride 10 mEq in 100 mL IVPB (not administered)  potassium chloride SA (K-DUR,KLOR-CON) CR tablet 40 mEq (not administered)  calcium carbonate (TUMS - dosed in mg elemental calcium) chewable tablet 200 mg of elemental calcium (not administered)  sodium chloride 0.9 % 1,000 mL with thiamine 100 mg, folic acid 1 mg, multivitamins adult 10 mL, magnesium sulfate 1 g infusion (not administered)    MDM   Final diagnoses:  None    MDM Reviewed: previous chart, nursing note and vitals Reviewed previous: ECG and labs Interpretation: labs, ECG and x-ray (low potassium and calcium NACPD by me on  XR) Consults: admitting MD  CRITICAL CARE Performed by: Jasmine Awe Total critical care time: 75 minutes Critical care time was exclusive of separately billable procedures and treating other patients. Critical care was necessary to treat or prevent imminent or life-threatening deterioration. Critical care was time spent personally by me on the following activities: development of treatment plan with patient and/or surrogate as well as nursing, discussions with consultants, evaluation of patient's response to treatment, examination of patient, obtaining history from patient or surrogate, ordering and performing treatments and interventions, ordering and review of laboratory studies, ordering and review of radiographic studies, pulse oximetry and re-evaluation of patient's condition.  Will need inpatient stabilization prior to detox.  Admit to stepdown    I personally performed the services described in this documentation, which was scribed in my presence. The recorded information has been reviewed and is accurate.      Cy Blamer, MD 06/17/15 [phone]

## 2015-06-17 NOTE — H&P (Addendum)
[facility] History and Physical  [name] WUJ:[id number] DOB: Jan 06, 1960 DOA: 06/17/2015  Referring physician: none PCP: No PCP Per Patient   Tele Inpt  Chief Complaint: "I just so felt too weak to walk."  HPI: Miguel Washington is a 55 y.o. male patient came to the emergency room with a complaint of weakness. He has been drinking daily a large amount of block on this would. He states that his last drink was this morning. Patient drinks more than he eats. Patient states he was concerned today because of feeling of his eyes which he says happened over the last 2 days. Patient denies any recent illness.  Patient denies any history of alcohol or drug seizures. Denies any fevers, chills, cough, wheeze, chest pain, falls, syncope.  Review of Systems:  As per HPI otherwise 10 point review of systems negative.   Past Medical History  Diagnosis Date  . Hypertension   . Anxiety    Past Surgical History  Procedure Laterality Date  . Cholecystectomy     Social History:  reports that he has never smoked. He does not have any smokeless tobacco history on file. He reports that he drinks about 12.6 oz of alcohol per week. He reports that he does not use illicit drugs.  No Known Allergies  Family History  Problem Relation Age of Onset  . Cancer Mother     Breast cancer; brain metastasis  . Diabetes Mother   . Heart disease Father 58    AMI x 2.  . Hypertension Father   . Hypertension Sister   . Hypertension Sister   . Diabetes Sister      Prior to Admission medications   Medication Sig Start Date End Date Taking? Authorizing Provider  amLODipine (NORVASC) 2.5 MG tablet Take 1 tablet (2.5 mg total) by mouth daily. 01/31/15  Yes Wallis Bamberg, PA-C  cetirizine (ZYRTEC) 10 MG tablet Take 1 tablet (10 mg total) by mouth daily. 01/31/15  Yes Wallis Bamberg, PA-C  metoprolol (LOPRESSOR) 100 MG tablet Take 1 tablet (100 mg total) by mouth 2 (two) times daily. 01/31/15  Yes Wallis Bamberg, PA-C  azithromycin (ZITHROMAX) 250 MG tablet Start with 2 tablets today, then 1 daily thereafter. Patient not taking: Reported on 06/17/2015 01/31/15   Wallis Bamberg, PA-C  benzonatate (TESSALON) 100 MG capsule Take 1-2 capsules (100-200 mg total) by mouth 3 (three) times daily as needed for cough. Patient not taking: Reported on 06/17/2015 01/31/15   Wallis Bamberg, PA-C   Physical Exam: Filed Vitals:   06/17/15 0145 06/17/15 0215 06/17/15 0230 06/17/15 0245  BP: 125/75 142/95 132/74 135/83  Pulse: 79  74 81  Temp:      TempSrc:      Resp: SpO2: 99%  98% 100%    Wt Readings from Last 3 Encounters:  01/31/15 68.04 kg (150 lb)  12/10/13 73.483 kg (162 lb)  08/27/13 69.31 kg (152 lb 12.8 oz)    General:  Appears calm and comfortable Eyes:  PERRL, EOMI, normal lids, iris, Scleral icterus ENT:  grossly normal hearing, lips & tongue Neck:  no LAD, masses or thyromegaly Cardiovascular:  RRR, no m/r/g. No LE edema.  Respiratory:  CTA bilaterally, no w/r/r. Normal respiratory effort. Abdomen:  soft, Positive fluid wave, nontender palpation, nonrigid, no rebound Skin:  no rash or induration seen on limited exam Musculoskeletal:  grossly normal tone BUE/BLE Psychiatric:  grossly Anxious, speech fluent and appropriate Neurologic:  CN 2-12 grossly intact,  moves all extremities in coordinated fashion. liver flap           Labs on Admission:  Basic Metabolic Panel:  Recent Labs Lab 06/17/15 0016 06/17/15 0104  NA 128*  --   K 2.7*  --   CL 86*  --   CO2 23  --   GLUCOSE 61*  --   BUN 9  --   CREATININE 0.56*  --   CALCIUM 7.8*  --   MG  --  1.8   Liver Function Tests:  Recent Labs Lab 06/17/15 0016  AST 177*  ALT 38  ALKPHOS 267*  BILITOT 20.4*  PROT 6.9  ALBUMIN 2.1*    Recent Labs Lab 06/17/15 0016  LIPASE 76*    Recent Labs Lab 06/17/15 0104  AMMONIA 82*   CBC:  Recent Labs Lab 06/17/15 0016  WBC 8.0  HGB 13.2  HCT 36.2*  MCV 84.0    PLT 111*   Cardiac Enzymes: No results for input(s): CKTOTAL, CKMB, CKMBINDEX, TROPONINI in the last 168 hours.  BNP (last 3 results) No results for input(s): BNP in the last 8760 hours.  ProBNP (last 3 results) No results for input(s): PROBNP in the last 8760 hours.   Creatinine clearance cannot be calculated (Unknown ideal weight.)  CBG: No results for input(s): GLUCAP in the last 168 hours.  Radiological Exams on Admission: Dg Abd Acute W/chest  06/17/2015  CLINICAL DATA:  Jaundice. Upper abdominal pain and nausea for 1 week. Alcohol use. EXAM: DG ABDOMEN ACUTE W/ 1V CHEST COMPARISON:  Chest 01/07/2009.  CT abdomen and pelvis 06/03/2003. FINDINGS: Normal heart size and pulmonary vascularity. Blunting of the right costophrenic angle suggests a small effusion. No focal airspace disease or consolidation in the lungs. No pneumothorax. Mediastinal contours appear intact. Scattered gas and stool throughout the colon. No small or large bowel distention. No free intra-abdominal air. No abnormal air-fluid levels. Surgical clips in the right upper quadrant. Calcified phleboliths in the pelvis. No radiopaque stones. Visualized bones appear intact. IMPRESSION: Probable small right pleural effusion. Normal nonobstructive bowel gas pattern. Electronically Signed   By: Burman NievesWilliam  Stevens M.D.   On: 06/17/2015 02:50    EKG: Independently reviewed. Abnormal R-wave progression, prolonged QT  Assessment/Plan Principal Problem:   Hypokalemia Active Problems:   HYPERTENSION, BENIGN ESSENTIAL   Hyponatremia   Acute alcohol intoxication (HCC)   Abnormal LFTs   Hyperbilirubinemia   Prolonged Q-T interval on ECG    Hypokalemia Magnesium within normal limits 40 mEq orally and 40 mEq IV potassium have been ordered Will reassess in the morning  Prolonged QT Repeat EKG in afternoon  Alcohol ciwa protocol  Anxiety No thoughts of suicidal or homicidal ideation When necessary Ativan  Abnormal  liver labs Patient with acute hyperbilirubinemia Right upper quadrant ultrasound pending 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

## 2015-06-17 NOTE — Progress Notes (Signed)
Hypoglycemic Event  CBG: 55  Treatment: 25ml of D5 given IV  Symptoms: None identified  Follow-up CBG: Time: 14:24 CBG Result:103  Possible Reasons for Event: Patient refusing to eat, poor appetite  Comments/MD notified: Dr. Sunnie Nielsenegalado notified and placed new orders for Q4 CBG checks and changed fluids to D5/NS @ 16400ml/hr.      Miguel Washington, Miguel Washington

## 2015-06-17 NOTE — Progress Notes (Addendum)
PROGRESS NOTE    Miguel Washington  ZOX:[id number] DOB: 1960-07-04 DOA: 06/17/2015 PCP: No PCP Per Patient   Brief Narrative: Miguel Washington is a 55 y.o. male patient came to the emergency room with a complaint of weakness. He has been drinking daily a large amount of alcohol.  He states that his last drink was the morning of admission. Patient drinks more than he eats.   Admitted with alcoholic hepatitis, encephalopathy, alcohol intoxication.   Assessment & Plan:   Principal Problem:   Hypokalemia Active Problems:   HYPERTENSION, BENIGN ESSENTIAL   Hyponatremia   Acute alcohol intoxication (HCC)   Abnormal LFTs   Hyperbilirubinemia   Prolonged Q-T interval on ECG   Hyperammonemia (HCC)  Alcoholic hepatitis; transaminases  Follow LFT, check viral hepatitis. HIV.  Hyperbilirubinemia.  Right upper quadrant ultrasound : Evidence of portal venous hypertension with reversal of flow in the portal vein. The liver has an appearance consistent with hepatic steatosis and likely underlying parenchymal liver disease. While no focal liver lesions are identified, the sensitivity of ultrasound for focal liver lesions is diminished significantly in this circumstance. A small amount of ascites surrounding the liver is noted. Gallbladder is absent. No biliary duct dilatation evident.  Alcohol intoxication: alcohol level at 79. IV fluids. Will need to monitor subsequently for alcohol withdrawal.  CIWA protocol ordered.  Ativan PRN.   Acute encephalopathy;  In setting of alcohol use, intoxication, also hepatic encephalopathy Lactulose.  IV fluids.  CIWA in the event of alcohol withdrawal.    Hypoglycemia; change IV fluids to D 5.   Hypokalemia Magnesium within normal limits Replaced.   Prolonged QT Repeat EKG in am. QT on admission 506  Hypertension Hold home BP medications for now as patient was hypotensive when he first arrived in emergency room  Hyperammonemia Lactulose  TID   DVT prophylaxis: SCD, high risk for bleeding.  Code Status: Full code.  Family Communication: none at bedside.  Disposition Plan: remain in hospital    Consultants:   none  Procedures:   none  Antimicrobials:   none   Subjective: Patient is alert, confuse, answer questions incorrectly   Objective: Filed Vitals:   06/17/15 0215 06/17/15 0230 06/17/15 0245 06/17/15 0547  BP: 142/95 132/74 135/83 139/79  Pulse:  74 81 82  Temp:    98.4 F (36.9 C)  TempSrc:    Oral  Resp: 28 16 17 20   Height:    5\' 6"  (1.676 m)  Weight:    69.491 kg (153 lb 3.2 oz)  SpO2:  98% 100% 100%    Intake/Output Summary (Last 24 hours) at 06/17/15 0950 Last data filed at 06/17/15 0700  Gross per 24 hour  Intake  91.67 ml  Output      0 ml  Net  91.67 ml   Filed Weights   06/17/15 0547  Weight: 69.491 kg (153 lb 3.2 oz)    Examination:  General exam: Appears calm and comfortable confuse Respiratory system: Clear to auscultation. Respiratory effort normal. Cardiovascular system: S1 & S2 heard, RRR. No JVD, murmurs, rubs, gallops or clicks. No pedal edema. Gastrointestinal system: Abdomen is nondistended, soft and nontender. No organomegaly or masses felt. Normal bowel sounds heard. Central nervous system: Alert. . No focal neurological deficits. Extremities: Symmetric 5 x 5 power. Skin: No rashes, lesions or ulcers Psychiatry: confuse    Data Reviewed: I have personally reviewed following labs and imaging studies  CBC:  Recent Labs Lab 06/17/15 0016 06/17/15 0620  WBC  8.0 9.2  HGB 13.2 13.2  HCT 36.2* 37.2*  MCV 84.0 86.1  PLT 111* 99*   Basic Metabolic Panel:  Recent Labs Lab 06/17/15 0016 06/17/15 0104 06/17/15 0620  NA 128*  --  132*  K 2.7*  --  3.7  CL 86*  --  93*  CO2 23  --  19*  GLUCOSE 61*  --  50*  BUN 9  --  9  CREATININE 0.56*  --  0.61  CALCIUM 7.8*  --  7.5*  MG  --  1.8  --    GFR: Estimated Creatinine Clearance: 95.3 mL/min (by  C-G formula based on Cr of 0.61). Liver Function Tests:  Recent Labs Lab 06/17/15 0016  AST 177*  ALT 38  ALKPHOS 267*  BILITOT 20.4*  PROT 6.9  ALBUMIN 2.1*    Recent Labs Lab 06/17/15 0016  LIPASE 76*    Recent Labs Lab 06/17/15 0104  AMMONIA 82*   Coagulation Profile:  Recent Labs Lab 06/17/15 0620  INR 1.72*   Cardiac Enzymes: No results for input(s): CKTOTAL, CKMB, CKMBINDEX, TROPONINI in the last 168 hours. BNP (last 3 results) No results for input(s): PROBNP in the last 8760 hours. HbA1C: No results for input(s): HGBA1C in the last 72 hours. CBG: No results for input(s): GLUCAP in the last 168 hours. Lipid Profile: No results for input(s): CHOL, HDL, LDLCALC, TRIG, CHOLHDL, LDLDIRECT in the last 72 hours. Thyroid Function Tests: No results for input(s): TSH, T4TOTAL, FREET4, T3FREE, THYROIDAB in the last 72 hours. Anemia Panel: No results for input(s): VITAMINB12, FOLATE, FERRITIN, TIBC, IRON, RETICCTPCT in the last 72 hours. Sepsis Labs: No results for input(s): PROCALCITON, LATICACIDVEN in the last 168 hours.  No results found for this or any previous visit (from the past 240 hour(s)).       Radiology Studies: Dg Abd Acute W/chest  06/17/2015  CLINICAL DATA:  Jaundice. Upper abdominal pain and nausea for 1 week. Alcohol use. EXAM: DG ABDOMEN ACUTE W/ 1V CHEST COMPARISON:  Chest 01/07/2009.  CT abdomen and pelvis 06/03/2003. FINDINGS: Normal heart size and pulmonary vascularity. Blunting of the right costophrenic angle suggests a small effusion. No focal airspace disease or consolidation in the lungs. No pneumothorax. Mediastinal contours appear intact. Scattered gas and stool throughout the colon. No small or large bowel distention. No free intra-abdominal air. No abnormal air-fluid levels. Surgical clips in the right upper quadrant. Calcified phleboliths in the pelvis. No radiopaque stones. Visualized bones appear intact. IMPRESSION: Probable small  right pleural effusion. Normal nonobstructive bowel gas pattern. Electronically Signed   By: Burman Nieves M.D.   On: 06/17/2015 02:50   US Abdomen Limited Ruq  06/17/2015  CLINICAL DATA:  Elevated liver enzymes EXAM: US ABDOMEN LIMITED - RIGHT UPPER QUADRANT COMPARISON:  August 15, 2013 FINDINGS: Gallbladder: Surgically absent. Common bile duct: Diameter: 3 mm. No intrahepatic or extrahepatic biliary duct dilatation. Liver: No focal lesion identified. The liver echogenicity overall is increased. Flow in the portal vein is hepatofugal, opposite of normal anatomic direction. A small amount of ascites is noted around the liver. IMPRESSION: Evidence of portal venous hypertension with reversal of flow in the portal vein. The liver has an appearance consistent with hepatic steatosis and likely underlying parenchymal liver disease. While no focal liver lesions are identified, the sensitivity of ultrasound for focal liver lesions is diminished significantly in this circumstance. A small amount of ascites surrounding the liver is noted. Gallbladder is absent. No biliary duct dilatation evident. Electronically  Signed   By: Bretta BangWilliam  Woodruff III M.D.   On: 06/17/2015 07:24        Scheduled Meds: . calcium carbonate  1 tablet Oral Once  . feeding supplement (ENSURE ENLIVE)  237 mL Oral BID BM  . folic acid  1 mg Oral Daily  . lactulose  30 g Oral BID  . loratadine  10 mg Oral Daily  . multivitamin with minerals  1 tablet Oral Daily  . sodium chloride flush  3 mL Intravenous Q12H  . thiamine  100 mg Oral Daily   Continuous Infusions: . sodium chloride 100 mL/hr at 06/17/15 0605     LOS: 0 days    Time spent: 35 minutes.     Alba Coryegalado, Del Overfelt A, MD [facility] Pager [phone]  If 7PM-7AM, please contact night-coverage [email or web address] Password [email or web address] 06/17/2015, 9:50 AM

## 2015-06-17 NOTE — Progress Notes (Signed)
Patient sleeping

## 2015-06-17 NOTE — ED Notes (Signed)
Patient states he is trying to detox from alcohol, states vomiting and unable to eat-states did have a drink earlier, patient is weak, needs assist to stand, schlera are icteric

## 2015-06-17 NOTE — ED Notes (Signed)
Pt in radiology 

## 2015-06-17 NOTE — ED Notes (Signed)
Pt is aware that a urine sample is needed.  

## 2015-06-17 NOTE — ED Notes (Signed)
Patient c/o nausea, lightheadedness x3 days. Patient denies vomiting, diarrhea. Reports constipation, denies pain, denies urinary symptoms.

## 2015-06-17 NOTE — Progress Notes (Signed)
Patient is sleeping during this time

## 2015-06-18 DIAGNOSIS — F10129 Alcohol abuse with intoxication, unspecified: Secondary | ICD-10-CM

## 2015-06-18 LAB — PROTIME-INR
INR: 1.93 — ABNORMAL HIGH (ref 0.00–1.49)
Prothrombin Time: 21.3 seconds — ABNORMAL HIGH (ref 11.6–15.2)

## 2015-06-18 LAB — HEPATITIS PANEL, ACUTE
HEP A IGM: NEGATIVE
HEP B S AG: NEGATIVE
Hep B C IgM: NEGATIVE

## 2015-06-18 LAB — COMPREHENSIVE METABOLIC PANEL
ALBUMIN: 1.9 g/dL — AB (ref 3.5–5.0)
ALT: 47 U/L (ref 17–63)
ANION GAP: 9 (ref 5–15)
AST: 209 U/L — ABNORMAL HIGH (ref 15–41)
Alkaline Phosphatase: 215 U/L — ABNORMAL HIGH (ref 38–126)
BUN: 12 mg/dL (ref 6–20)
CHLORIDE: 101 mmol/L (ref 101–111)
CO2: 26 mmol/L (ref 22–32)
Calcium: 7.6 mg/dL — ABNORMAL LOW (ref 8.9–10.3)
Creatinine, Ser: 0.6 mg/dL — ABNORMAL LOW (ref 0.61–1.24)
GFR calc Af Amer: >60 mL/min (ref >60)
GFR calc non Af Amer: >60 mL/min (ref >60)
GLUCOSE: 150 mg/dL — AB (ref 65–99)
POTASSIUM: 2.3 mmol/L — AB (ref 3.5–5.1)
SODIUM: 136 mmol/L (ref 135–145)
Total Bilirubin: 21.7 mg/dL (ref 0.3–1.2)
Total Protein: 6.4 g/dL — ABNORMAL LOW (ref 6.5–8.1)

## 2015-06-18 LAB — GLUCOSE, CAPILLARY
GLUCOSE-CAPILLARY: 121 mg/dL — AB (ref 65–99)
GLUCOSE-CAPILLARY: 123 mg/dL — AB (ref 65–99)
GLUCOSE-CAPILLARY: 138 mg/dL — AB (ref 65–99)
GLUCOSE-CAPILLARY: 141 mg/dL — AB (ref 65–99)
GLUCOSE-CAPILLARY: 154 mg/dL — AB (ref 65–99)
Glucose-Capillary: 148 mg/dL — ABNORMAL HIGH (ref 65–99)

## 2015-06-18 LAB — CBC
HEMATOCRIT: 35.5 % — AB (ref 39.0–52.0)
HEMOGLOBIN: 12.4 g/dL — AB (ref 13.0–17.0)
MCH: 29.9 pg (ref 26.0–34.0)
MCHC: 34.9 g/dL (ref 30.0–36.0)
MCV: 85.5 fL (ref 78.0–100.0)
Platelets: 74 10*3/uL — ABNORMAL LOW (ref 150–400)
RBC: 4.15 MIL/uL — AB (ref 4.22–5.81)
RDW: 19.1 % — ABNORMAL HIGH (ref 11.5–15.5)
WBC: 9.4 10*3/uL (ref 4.0–10.5)

## 2015-06-18 LAB — AMMONIA: Ammonia: 124 umol/L — ABNORMAL HIGH (ref 9–35)

## 2015-06-18 LAB — BILIRUBIN, DIRECT: Bilirubin, Direct: 12.1 mg/dL — ABNORMAL HIGH (ref 0.1–0.5)

## 2015-06-18 LAB — HIV ANTIBODY (ROUTINE TESTING W REFLEX): HIV SCREEN 4TH GENERATION: NONREACTIVE

## 2015-06-18 MED ORDER — POTASSIUM CHLORIDE CRYS ER 20 MEQ PO TBCR
40.0000 meq | EXTENDED_RELEASE_TABLET | ORAL | Status: DC
  Administered 2015-06-18: 40 meq via ORAL
  Filled 2015-06-18 (×2): qty 2

## 2015-06-18 MED ORDER — LACTULOSE ENEMA
300.0000 mL | Freq: Three times a day (TID) | ORAL | Status: DC
  Administered 2015-06-18 – 2015-06-19 (×4): 300 mL via RECTAL
  Filled 2015-06-18 (×9): qty 300

## 2015-06-18 MED ORDER — POTASSIUM CHLORIDE 10 MEQ/100ML IV SOLN
10.0000 meq | INTRAVENOUS | Status: AC
  Administered 2015-06-18 (×4): 10 meq via INTRAVENOUS
  Filled 2015-06-18 (×3): qty 100

## 2015-06-18 MED ORDER — METHYLPREDNISOLONE SODIUM SUCC 40 MG IJ SOLR
40.0000 mg | Freq: Every day | INTRAMUSCULAR | Status: DC
  Administered 2015-06-18 – 2015-06-20 (×3): 40 mg via INTRAVENOUS
  Filled 2015-06-18 (×3): qty 1

## 2015-06-18 NOTE — Progress Notes (Signed)
Patient has had decreased output (50cc for shift at 1600). Bladder scan revealed 500cc urine in bladder. Order for F/C received.  Earnest ConroyBrooke M. Clelia CroftShaw, RN

## 2015-06-18 NOTE — Progress Notes (Signed)
Initial Nutrition Assessment  DOCUMENTATION CODES:   Non-severe (moderate) malnutrition in context of chronic illness  INTERVENTION:   Continue Ensure Enlive po BID, each supplement provides 350 kcal and 20 grams of protein Encourage PO intake RD to continue to monitor for plan  NUTRITION DIAGNOSIS:   Increased nutrient needs related to other (see comment) (ETOH abuse) as evidenced by estimated needs.  GOAL:   Patient will meet greater than or equal to 90% of their needs  MONITOR:   PO intake, Supplement acceptance, Labs, Weight trends, I & O's  REASON FOR ASSESSMENT:   Malnutrition Screening Tool    ASSESSMENT:   55 y.o. male patient came to the emergency room with a complaint of weakness. He has been drinking daily a large amount of alcohol. He states that his last drink was the morning of admission. Patient drinks more than he eats.   Patient lethargic and unable to answer questions at time of visit. Pt was visited by SLP for evaluation but pt was unable to participate per chart review. SLP to evaluate as pt is having trouble swallowing. Pt has been consuming ~8 mixed drinks a day per H&P. Pt consumes more alcohol than food. Pt refused dinner last night per documentation. Pt reported not being able to keep food down for 2 months PTA. Pt has been ordered Ensure supplements but is unable to swallow at this time.  Weight is stable per weight history. Pt with moderate muscle depletion in clavicle region.  Medications: Folic acid tablet daily, Multivitamin with minerals daily, Thiamine tablet daily, D5 w/ .9% NaCL infusion @ 100 ml/hr -provides 408 kcal Labs reviewed: CBGs: 123-141 Low K  Diet Order:  Diet regular Room service appropriate?: Yes; Fluid consistency:: Thin  Skin:  Reviewed, no issues  Last BM:  6/13  Height:   Ht Readings from Last 1 Encounters:  06/17/15 5\' 6"  (1.676 m)    Weight:   Wt Readings from Last 1 Encounters:  06/17/15 153 lb 3.2 oz  (69.491 kg)    Ideal Body Weight:  64.5 kg  BMI:  Body mass index is 24.74 kg/(m^2).  Estimated Nutritional Needs:   Kcal:  2100-2300  Protein:  105-115g  Fluid:  2.3L/day  EDUCATION NEEDS:   No education needs identified at this time  Tilda FrancoLindsey Karlo Goeden, MS, RD, LDN Pager: [phone] After Hours Pager: [phone]

## 2015-06-18 NOTE — Evaluation (Signed)
Clinical/Bedside Swallow Evaluation Patient Details  Name: Miguel BottomsChristopher Rudin MRN: [id number] Date of Birth: 01/05/1960  Today's Date: 06/18/2015 Time: SLP Start Time (ACUTE ONLY): 1010 SLP Stop Time (ACUTE ONLY): 1032 SLP Time Calculation (min) (ACUTE ONLY): 22 min  Past Medical History:  Past Medical History  Diagnosis Date  . Hypertension   . Anxiety    Past Surgical History:  Past Surgical History  Procedure Laterality Date  . Cholecystectomy     HPI:  55 yo [facility] - alcoholic liver disease.  PMH + for ETOH use, HTN, hypokalemia.  Pt found to have probable right pleural effusion.  Referral ordered due to pt holding pills and not swallowing.  RN reports pt received medicine last pm but required siginficant coaxing.     Assessment / Plan / Recommendation Clinical Impression  Pt sleeping upon SLP entrance to room but did awaken with verbal/tactile stimulation. No focal CN deficits noted and pt actually sealed lips tightly to prevent consumption of ice.    RN present and provided pt with potassium crushed with applesauce - to which pt orally held for extended amount of time.  Pt spat out crushed medicine with applesauce per SLP cue after he orally held for extended period of time (approximately 5 minutes with multiple respiratory cycles noted and hiccups).  Many compensation strategies attempted without success = including dry spoon pressure to tongue, pt self feeding with assist and providing liquids.  Delayed cough x1 noted - suspect possible aspiration of liquid.    SLP set up oral suction and cleaned pt's oral cavity.  At this this, his mentation prevents him from swallowing  - even when alert!  Recommend to change medications to Iv that are approrpriate.  SLP to follow up to help in determining least restrictive and most comforting diet for pt.      Aspiration Risk  Severe aspiration risk;Risk for inadequate nutrition/hydration    Diet Recommendation NPO;Free  water protocol after oral care   Medication Administration: Via alternative means    Other  Recommendations Oral Care Recommendations: Oral care QID   Follow up Recommendations    tbd   Frequency and Duration min 2x/week  1 week       Prognosis Barriers to Reach Goals: Cognitive deficits (pt in DTs per RN)      Swallow Study   General Date of Onset: 06/18/15 HPI: 55 [facility] - alcoholic liver disease.  PMH + for ETOH use, HTN, hypokalemia.  Pt found to have probable right pleural effusion.  Referral ordered due to pt holding pills and not swallowing.  RN reports pt received medicine last pm but required siginficant coaxing.   Type of Study: Bedside Swallow Evaluation Diet Prior to this Study: Regular;Thin liquids Temperature Spikes Noted: Yes Respiratory Status: Room air History of Recent Intubation: No Behavior/Cognition: Lethargic/Drowsy;Doesn't follow directions Oral Care Completed by SLP: Yes (via oral suction) Oral Cavity - Dentition: Adequate natural dentition Vision: Functional for self-feeding Self-Feeding Abilities: Total assist Patient Positioning: Upright in bed Baseline Vocal Quality: Normal Volitional Cough: Cognitively unable to elicit;Other (Comment) Volitional Swallow: Unable to elicit    Oral/Motor/Sensory Function Overall Oral Motor/Sensory Function: Generalized oral weakness (generalized gross weakness)   Ice Chips Ice chips: Not tested Other Comments: pt declined, sealed his lips tightly to prevent placement   Thin Liquid Thin Liquid: Impaired Presentation: Spoon Oral Phase Impairments: Poor awareness of bolus;Reduced labial seal Oral Phase Functional Implications: Oral holding  Nectar Thick Nectar Thick Liquid: Not tested   Honey Thick Honey Thick Liquid: Not tested   Puree Puree: Impaired Presentation: Spoon Oral Phase Impairments: Reduced labial seal;Reduced lingual movement/coordination;Poor awareness of bolus Oral Phase  Functional Implications: Oral holding Other Comments: pt spat out crushed medicine with applesauce per SLP cue after he orally held for extended period of time, mulltiple compensation strategies attempted without success = includling dry spoon pressure to tongue, pt self feeding with assist and providing liquids   Solid   GO   Solid: Not tested        Mills Koller, MS [facility] SLP [phone]

## 2015-06-18 NOTE — Progress Notes (Signed)
PROGRESS NOTE    Miguel BottomsChristopher Pokorski  ZOX:[id number] DOB: 08/30/1960 DOA: 06/17/2015 PCP: No PCP Per Patient   Brief Narrative: Miguel Washington is a 55 y.o. male patient came to the emergency room with a complaint of weakness. He has been drinking daily a large amount of alcohol.  He states that his last drink was the morning of admission. Patient drinks more than he eats.   Admitted with alcoholic hepatitis, encephalopathy, alcohol intoxication.   Assessment & Plan:   Principal Problem:   Alcoholic liver disease (HCC) Active Problems:   HYPERTENSION, BENIGN ESSENTIAL   Hyponatremia   Acute alcohol intoxication (HCC)   Hypokalemia   Abnormal LFTs   Hyperbilirubinemia   Prolonged Q-T interval on ECG   Hyperammonemia (HCC)  Alcoholic hepatitis;  -On 06/18/2015 lab work showing a total bilirubin of 21.7, AST of 209, ALT of 47  INR was 1.9 and PTT of 21.3. -He was admitted on 06/17/2015 presented after trying to detox from alcohol for the last 3 days although admitted to drinking alcohol the day of his admission. -Calculated his hepatic discriminate function at 52; greater than 32 indicating severe disease, thus this patient would likely benefit from glucocorticosteroid treatment -Case was discussed with Dr. Randa EvensEdwards of GI who agreed, recommended centimeter 40 mg IV daily  Alcohol intoxication:  -Patient having history of chronic alcohol abuse, apparently had been try to detox for the past 3 days however presented with alcohol level of 79. -This complicated by on-call-induced hepatitis  Acute encephalopathy;  -Suspect secondary to hepatic encephalopathy with labs showing an elevated ammonia level of 124. -During my evaluation he was lethargic, difficult to arouse -Plan to switch him to lactulose enema, monitor response today. Of note RN reporting that earlier this morning he received 2 mg of IV Ativan.   Hypoglycemia; change IV fluids to D 5.   Hypokalemia -A.m. lab work  showing potassium of 2.3 -4 runs of KCl given on 06/18/2015  Prolonged QT Repeat EKG in am. QT on admission 506  Hypertension Hold home BP medications for now as patient was hypotensive when he first arrived in emergency room  Hyperammonemia Lactulose enema TID   DVT prophylaxis: SCD, high risk for bleeding.  Code Status: Full code.  Family Communication: none at bedside.  Disposition Plan: remain in hospital    Consultants:   none  Procedures:   none  Antimicrobials:   none   Subjective: He is lethargic, difficult to arouse  Objective: Filed Vitals:   06/17/15 1230 06/17/15 1804 06/17/15 2028 06/18/15 0524  BP: 132/77 115/68 124/78 125/77  Pulse: 95 99 106 92  Temp: 100.1 F (37.8 C) 99.1 F (37.3 C) 98.6 F (37 C) 97.9 F (36.6 C)  TempSrc: Oral Oral Axillary Axillary  Resp: 18 17 18 18   Height:      Weight:      SpO2: 100% 100% 98% 100%    Intake/Output Summary (Last 24 hours) at 06/18/15 1247 Last data filed at 06/18/15 0600  Gross per 24 hour  Intake   2540 ml  Output    200 ml  Net   2340 ml   Filed Weights   06/17/15 0547  Weight: 69.491 kg (153 lb 3.2 oz)    Examination:  General exam:Lethargic, difficult to arouse, significant icterus Respiratory system: Clear to auscultation. Respiratory effort normal. Cardiovascular system: S1 & S2 heard, RRR. No JVD, murmurs, rubs, gallops or clicks. No pedal edema. Gastrointestinal system: There is some abdominal distention, soft and nontender.  No organomegaly or masses felt. Normal bowel sounds heard. Central nervous system: Alert. . No focal neurological deficits. Extremities: Symmetric 5 x 5 power. Skin: No rashes, lesions or ulcers Psychiatry: confuse    Data Reviewed: I have personally reviewed following labs and imaging studies  CBC:  Recent Labs Lab 06/17/15 0016 06/17/15 0620 06/18/15 0455  WBC 8.0 9.2 9.4  HGB 13.2 13.2 12.4*  HCT 36.2* 37.2* 35.5*  MCV 84.0 86.1 85.5    PLT 111* 99* 74*   Basic Metabolic Panel:  Recent Labs Lab 06/17/15 0016 06/17/15 0104 06/17/15 0620 06/18/15 0455  NA 128*  --  132* 136  K 2.7*  --  3.7 2.3*  CL 86*  --  93* 101  CO2 23  --  19* 26  GLUCOSE 61*  --  50* 150*  BUN 9  --  9 12  CREATININE 0.56*  --  0.61 0.60*  CALCIUM 7.8*  --  7.5* 7.6*  MG  --  1.8  --   --    GFR: Estimated Creatinine Clearance: 95.3 mL/min (by C-G formula based on Cr of 0.6). Liver Function Tests:  Recent Labs Lab 06/17/15 0016 06/18/15 0455  AST 177* 209*  ALT 38 47  ALKPHOS 267* 215*  BILITOT 20.4* 21.7*  PROT 6.9 6.4*  ALBUMIN 2.1* 1.9*    Recent Labs Lab 06/17/15 0016  LIPASE 76*    Recent Labs Lab 06/17/15 0104 06/18/15 0455  AMMONIA 82* 124*   Coagulation Profile:  Recent Labs Lab 06/17/15 0620 06/18/15 0455  INR 1.72* 1.93*   Cardiac Enzymes: No results for input(s): CKTOTAL, CKMB, CKMBINDEX, TROPONINI in the last 168 hours. BNP (last 3 results) No results for input(s): PROBNP in the last 8760 hours. HbA1C: No results for input(s): HGBA1C in the last 72 hours. CBG:  Recent Labs Lab 06/17/15 2026 06/18/15 0041 06/18/15 0457 06/18/15 0720 06/18/15 1144  GLUCAP 130* 154* 123* 141* 138*   Lipid Profile: No results for input(s): CHOL, HDL, LDLCALC, TRIG, CHOLHDL, LDLDIRECT in the last 72 hours. Thyroid Function Tests: No results for input(s): TSH, T4TOTAL, FREET4, T3FREE, THYROIDAB in the last 72 hours. Anemia Panel: No results for input(s): VITAMINB12, FOLATE, FERRITIN, TIBC, IRON, RETICCTPCT in the last 72 hours. Sepsis Labs: No results for input(s): PROCALCITON, LATICACIDVEN in the last 168 hours.  No results found for this or any previous visit (from the past 240 hour(s)).       Radiology Studies: Dg Abd Acute W/chest  06/17/2015  CLINICAL DATA:  Jaundice. Upper abdominal pain and nausea for 1 week. Alcohol use. EXAM: DG ABDOMEN ACUTE W/ 1V CHEST COMPARISON:  Chest 01/07/2009.  CT  abdomen and pelvis 06/03/2003. FINDINGS: Normal heart size and pulmonary vascularity. Blunting of the right costophrenic angle suggests a small effusion. No focal airspace disease or consolidation in the lungs. No pneumothorax. Mediastinal contours appear intact. Scattered gas and stool throughout the colon. No small or large bowel distention. No free intra-abdominal air. No abnormal air-fluid levels. Surgical clips in the right upper quadrant. Calcified phleboliths in the pelvis. No radiopaque stones. Visualized bones appear intact. IMPRESSION: Probable small right pleural effusion. Normal nonobstructive bowel gas pattern. Electronically Signed   By: Burman Nieves M.D.   On: 06/17/2015 02:50   US Abdomen Limited Ruq  06/17/2015  CLINICAL DATA:  Elevated liver enzymes EXAM: US ABDOMEN LIMITED - RIGHT UPPER QUADRANT COMPARISON:  August 15, 2013 FINDINGS: Gallbladder: Surgically absent. Common bile duct: Diameter: 3 mm. No intrahepatic or  extrahepatic biliary duct dilatation. Liver: No focal lesion identified. The liver echogenicity overall is increased. Flow in the portal vein is hepatofugal, opposite of normal anatomic direction. A small amount of ascites is noted around the liver. IMPRESSION: Evidence of portal venous hypertension with reversal of flow in the portal vein. The liver has an appearance consistent with hepatic steatosis and likely underlying parenchymal liver disease. While no focal liver lesions are identified, the sensitivity of ultrasound for focal liver lesions is diminished significantly in this circumstance. A small amount of ascites surrounding the liver is noted. Gallbladder is absent. No biliary duct dilatation evident. Electronically Signed   By: Bretta Bang III M.D.   On: 06/17/2015 07:24        Scheduled Meds: . calcium carbonate  1 tablet Oral Once  . feeding supplement (ENSURE ENLIVE)  237 mL Oral BID BM  . folic acid  1 mg Oral Daily  . lactulose  300 mL Rectal TID   . loratadine  10 mg Oral Daily  . multivitamin with minerals  1 tablet Oral Daily  . potassium chloride  10 mEq Intravenous Q1 Hr x 4  . sodium chloride flush  3 mL Intravenous Q12H  . thiamine  100 mg Oral Daily   Continuous Infusions: . dextrose 5 % and 0.9% NaCl 100 mL/hr at 06/18/15 1027     LOS: 1 day    Time spent: 35 minutes.     Jeralyn Bennett, MD [facility] Pager [phone]  If 7PM-7AM, please contact night-coverage [email or web address] Password [facility] 06/18/2015, 12:47 PM

## 2015-06-18 NOTE — Progress Notes (Signed)
CRITICAL VALUE ALERT  Critical value received:  K+2.3 & Total bilirubin 21.7   Date of notification:  06/18/15  Time of notification:  0545  Critical value read back: Yes   Nurse who received alert:  Governor SpeckingJerry Ivanka Kirshner RN   MD notified (1st page): Craige CottaKirby   Time of first page: [phone]  MD notified (2nd page):  Time of second page:  Responding MD:  Craige CottaKirby   Time MD responded: 0556 Lenox PondsJerry L Montrel Donahoe, RN

## 2015-06-18 NOTE — Evaluation (Signed)
SLP Cancellation Note  Patient Details Name: Miguel Washington MRN: [id number] DOB: [birth year]   Cancelled treatment:       Reason Eval/Treat Not Completed: Other (comment);Fatigue/lethargy limiting ability to participate (rn reports pt received ativan for pain)  [name], MS [facility] SLP [phone]

## 2015-06-19 DIAGNOSIS — E7220 Disorder of urea cycle metabolism, unspecified: Secondary | ICD-10-CM

## 2015-06-19 DIAGNOSIS — F10121 Alcohol abuse with intoxication delirium: Secondary | ICD-10-CM

## 2015-06-19 LAB — COMPREHENSIVE METABOLIC PANEL
ALT: 48 U/L (ref 17–63)
AST: 173 U/L — AB (ref 15–41)
Albumin: 1.8 g/dL — ABNORMAL LOW (ref 3.5–5.0)
Alkaline Phosphatase: 191 U/L — ABNORMAL HIGH (ref 38–126)
Anion gap: 5 (ref 5–15)
BUN: 7 mg/dL (ref 6–20)
CHLORIDE: 108 mmol/L (ref 101–111)
CO2: 26 mmol/L (ref 22–32)
CREATININE: 0.41 mg/dL — AB (ref 0.61–1.24)
Calcium: 7.9 mg/dL — ABNORMAL LOW (ref 8.9–10.3)
GFR calc Af Amer: >60 mL/min (ref >60)
GFR calc non Af Amer: >60 mL/min (ref >60)
Glucose, Bld: 159 mg/dL — ABNORMAL HIGH (ref 65–99)
Potassium: 2.2 mmol/L — CL (ref 3.5–5.1)
SODIUM: 139 mmol/L (ref 135–145)
Total Bilirubin: 22.5 mg/dL (ref 0.3–1.2)
Total Protein: 6.3 g/dL — ABNORMAL LOW (ref 6.5–8.1)

## 2015-06-19 LAB — CBC
HCT: 33.3 % — ABNORMAL LOW (ref 39.0–52.0)
Hemoglobin: 12 g/dL — ABNORMAL LOW (ref 13.0–17.0)
MCH: 30.5 pg (ref 26.0–34.0)
MCHC: 36 g/dL (ref 30.0–36.0)
MCV: 84.7 fL (ref 78.0–100.0)
PLATELETS: 70 10*3/uL — AB (ref 150–400)
RBC: 3.93 MIL/uL — ABNORMAL LOW (ref 4.22–5.81)
RDW: 19.4 % — AB (ref 11.5–15.5)
WBC: 9 10*3/uL (ref 4.0–10.5)

## 2015-06-19 LAB — GLUCOSE, CAPILLARY
GLUCOSE-CAPILLARY: 120 mg/dL — AB (ref 65–99)
Glucose-Capillary: 125 mg/dL — ABNORMAL HIGH (ref 65–99)
Glucose-Capillary: 113 mg/dL — ABNORMAL HIGH (ref 65–99)
Glucose-Capillary: 138 mg/dL — ABNORMAL HIGH (ref 65–99)

## 2015-06-19 LAB — AMMONIA: Ammonia: 60 umol/L — ABNORMAL HIGH (ref 9–35)

## 2015-06-19 MED ORDER — POTASSIUM CHLORIDE 10 MEQ/100ML IV SOLN
10.0000 meq | INTRAVENOUS | Status: AC
  Administered 2015-06-19 (×5): 10 meq via INTRAVENOUS
  Filled 2015-06-19 (×5): qty 100

## 2015-06-19 MED ORDER — POTASSIUM CHLORIDE CRYS ER 20 MEQ PO TBCR: 20.0000 meq | EXTENDED_RELEASE_TABLET | Freq: Once | ORAL | Status: DC

## 2015-06-19 MED ORDER — LACTULOSE ENEMA
300.0000 mL | Freq: Three times a day (TID) | ORAL | Status: DC
  Administered 2015-06-19 – 2015-06-20 (×4): 300 mL via RECTAL
  Filled 2015-06-19 (×9): qty 300

## 2015-06-19 MED ORDER — LACTULOSE 10 GM/15ML PO SOLN
20.0000 g | Freq: Three times a day (TID) | ORAL | Status: DC
  Administered 2015-06-19: 20 g via ORAL
  Filled 2015-06-19: qty 30

## 2015-06-19 NOTE — Progress Notes (Signed)
Lab received of K+ 2.2 and total bilirubin of 22.5. Called to on-call provider. Orders received.

## 2015-06-19 NOTE — Progress Notes (Signed)
PROGRESS NOTE    Miguel Washington  RUE:[id number] DOB: 02-Sep-1960 DOA: 06/17/2015 PCP: No PCP Per Patient   Brief Narrative: Miguel Washington is a 55 y.o. male patient came to the emergency room with a complaint of weakness. He has been drinking daily a large amount of alcohol.  He states that his last drink was the morning of admission. Patient drinks more than he eats.   Admitted with alcoholic hepatitis, encephalopathy, alcohol intoxication.   Assessment & Plan:   Principal Problem:   Alcoholic liver disease (HCC) Active Problems:   HYPERTENSION, BENIGN ESSENTIAL   Hyponatremia   Acute alcohol intoxication (HCC)   Hypokalemia   Abnormal LFTs   Hyperbilirubinemia   Prolonged Q-T interval on ECG   Hyperammonemia (HCC)  Alcoholic hepatitis;  -On 06/18/2015 lab work showing a total bilirubin of 21.7, AST of 209, ALT of 47  INR was 1.9 and PTT of 21.3. -He was admitted on 06/17/2015 presented after trying to detox from alcohol for the last 3 days although admitted to drinking alcohol the day of his admission. -Calculated his hepatic discriminate function at 52; greater than 32 indicating severe disease, thus this patient would likely benefit from glucocorticosteroid treatment -Case was discussed with Dr. Randa Evens of GI who agreed, recommended Solumedrol 40 mg IV daily -On 06/19/2015 he appears to be more awake and interactive. Remains confused. Labs showing modest downward trend in the liver enzymes. Total bilirubin and 22.5.  Alcohol intoxication:  -Patient having history of chronic alcohol abuse, apparently had been try to detox for the past 3 days however presented with alcohol level of 79. -This was complicated by alcohol induced hepatitis  Acute encephalopathy;  -Suspect secondary to hepatic encephalopathy with labs showing an elevated ammonia level of 124. -During my evaluation he was lethargic, difficult to arouse -Unable to tolerate by mouth intake he has been given  lactulose enemas, showing some improvement on 06/19/2015. I don't think he can still tolerate by mouth intake, will continue enemas for now   Hypoglycemia; change IV fluids to D 5.   Hypokalemia -A.m. lab work showing potassium of 2.2 - 6 runs of potassium chloride were ordered this morning  Prolonged QT Repeat EKG in am. QT on admission 506  Hypertension Hold home BP medications for now as patient was hypotensive when he first arrived in emergency room  Hyperammonemia Lactulose enema TID   DVT prophylaxis: SCD, high risk for bleeding.  Code Status: Full code.  Family Communication: Held family meeting on 06/19/2015 Disposition Plan: remain in hospital    Consultants:   none  Procedures:   none  Antimicrobials:   none  Subjective: He is arousable, remains confused encephalopathic however  Objective: Filed Vitals:   06/18/15 1519 06/18/15 2124 06/19/15 0452 06/19/15 0824  BP: 151/83 138/82 114/64 134/84  Pulse: 108 98 114 102  Temp: 97.8 F (36.6 C) 98.2 F (36.8 C) 98.4 F (36.9 C)   TempSrc: Axillary Oral Oral   Resp: Height:      Weight:      SpO2: 100% 99% 100% 100%    Intake/Output Summary (Last 24 hours) at 06/19/15 1325 Last data filed at 06/19/15 1212  Gross per 24 hour  Intake 2774.67 ml  Output   1950 ml  Net 824.67 ml   Filed Weights   06/17/15 0547  Weight: 69.491 kg (153 lb 3.2 oz)    Examination:  General exam: He is arousable, significant icterus Respiratory system: Clear to auscultation.  Respiratory effort normal. Cardiovascular system: S1 & S2 heard, RRR. No JVD, murmurs, rubs, gallops or clicks. No pedal edema. Gastrointestinal system: There is some abdominal distention, soft and nontender. No organomegaly or masses felt. Normal bowel sounds heard. Central nervous system: Alert. . No focal neurological deficits. Extremities: Symmetric 5 x 5 power. Skin: No rashes, lesions or ulcers Psychiatry: confuse    Data  Reviewed: I have personally reviewed following labs and imaging studies  CBC:  Recent Labs Lab 06/17/15 0016 06/17/15 0620 06/18/15 0455 06/19/15 0335  WBC 8.0 9.2 9.4 9.0  HGB 13.2 13.2 12.4* 12.0*  HCT 36.2* 37.2* 35.5* 33.3*  MCV 84.0 86.1 85.5 84.7  PLT 111* 99* 74* 70*   Basic Metabolic Panel:  Recent Labs Lab 06/17/15 0016 06/17/15 0104 06/17/15 0620 06/18/15 0455 06/19/15 0335  NA 128*  --  132* 136 139  K 2.7*  --  3.7 2.3* 2.2*  CL 86*  --  93* 101 108  CO2 23  --  19* 26 26  GLUCOSE 61*  --  50* 150* 159*  BUN 9  --  9 12 7   CREATININE 0.56*  --  0.61 0.60* 0.41*  CALCIUM 7.8*  --  7.5* 7.6* 7.9*  MG  --  1.8  --   --   --    GFR: Estimated Creatinine Clearance: 95.3 mL/min (by C-G formula based on Cr of 0.41). Liver Function Tests:  Recent Labs Lab 06/17/15 0016 06/18/15 0455 06/19/15 0335  AST 177* 209* 173*  ALT 38 47 48  ALKPHOS 267* 215* 191*  BILITOT 20.4* 21.7* 22.5*  PROT 6.9 6.4* 6.3*  ALBUMIN 2.1* 1.9* 1.8*    Recent Labs Lab 06/17/15 0016  LIPASE 76*    Recent Labs Lab 06/17/15 0104 06/18/15 0455 06/19/15 0335  AMMONIA 82* 124* 60*   Coagulation Profile:  Recent Labs Lab 06/17/15 0620 06/18/15 0455  INR 1.72* 1.93*   Cardiac Enzymes: No results for input(s): CKTOTAL, CKMB, CKMBINDEX, TROPONINI in the last 168 hours. BNP (last 3 results) No results for input(s): PROBNP in the last 8760 hours. HbA1C: No results for input(s): HGBA1C in the last 72 hours. CBG:  Recent Labs Lab 06/18/15 1144 06/18/15 1655 06/18/15 2123 06/19/15 0728 06/19/15 1133  GLUCAP 138* 121* 148* 125* 113*   Lipid Profile: No results for input(s): CHOL, HDL, LDLCALC, TRIG, CHOLHDL, LDLDIRECT in the last 72 hours. Thyroid Function Tests: No results for input(s): TSH, T4TOTAL, FREET4, T3FREE, THYROIDAB in the last 72 hours. Anemia Panel: No results for input(s): VITAMINB12, FOLATE, FERRITIN, TIBC, IRON, RETICCTPCT in the last 72  hours. Sepsis Labs: No results for input(s): PROCALCITON, LATICACIDVEN in the last 168 hours.  No results found for this or any previous visit (from the past 240 hour(s)).       Radiology Studies: No results found.      Scheduled Meds: . calcium carbonate  1 tablet Oral Once  . feeding supplement (ENSURE ENLIVE)  237 mL Oral BID BM  . folic acid  1 mg Oral Daily  . lactulose  300 mL Rectal TID  . loratadine  10 mg Oral Daily  . methylPREDNISolone (SOLU-MEDROL) injection  40 mg Intravenous Daily  . multivitamin with minerals  1 tablet Oral Daily  . potassium chloride  20 mEq Oral Once  . sodium chloride flush  3 mL Intravenous Q12H  . thiamine  100 mg Oral Daily   Continuous Infusions: . dextrose 5 % and 0.9% NaCl 100 mL/hr at  06/19/15 0643     LOS: 2 days    Time spent: 35 minutes. Held 20 minutes face-to-face meeting with family members to update them and answer questions on Miguel Washington's diagnosis and prognosis    Jeralyn BennettZAMORA, Philbert Ocallaghan, MD [facility] Pager [phone]  If 7PM-7AM, please contact night-coverage [email or web address] Password [email or web address] 06/19/2015, 1:25 PM

## 2015-06-19 NOTE — Progress Notes (Signed)
Speech Language Pathology Treatment: Dysphagia  Patient Details Name: Miguel BottomsChristopher Washington MRN: [id number] DOB: [birth year] Today's Date: 06/19/2015 Time: [id number] SLP Time Calculation (min) (ACUTE ONLY): 20 min  Assessment / Plan / Recommendation Clinical Impression  Pt with much improved mentation today, able to initiate swallow  - although marginal delays continue.  Pt did require full assist with feeding due to weakness.  Delayed cough/throat clearing noted after large sequential boluses of thin via straw.  Small single boluses tolerated well without indication of airway comprise or pt complaint of dysphagia.   Slow but effective mastication with fruit cocktail = no residuals.  Pt did admit to some coughing with intake prior to admit via yes/no questions but did not provide details - uncertain to his ability to provide historical information.  Intake observed was limited as pt stated he was "fine" after only a few boluses.      Recommend to proceed with diet for pt due to improvement in mentation and swallowing.  SLP to follow up briefly to assess tolerance, readiness for dietary advancement and family/pt education.  RN informed of recommendations and swallow precautions posted in room.  Thanks.    HPI HPI: 55 yo [facility] with [facility] - alcoholic liver disease.  PMH + for ETOH use, HTN, hypokalemia.  Pt found to have probable right pleural effusion.  Referral ordered due to pt holding pills and not swallowing.  RN reports pt received medicine last pm but required siginficant coaxing.        SLP Plan  Continue with current plan of care     Recommendations  Diet recommendations: Dysphagia 3 (mechanical soft);Thin liquid Liquids provided via: Cup;Straw Medication Administration: Whole meds with puree Supervision: Staff to assist with self feeding;Full supervision/cueing for compensatory strategies Compensations: Small sips/bites;Slow rate;Lingual sweep for clearance of pocketing Postural  Changes and/or Swallow Maneuvers: Seated upright 90 degrees;Upright 30-60 min after meal Orally suction if pt holding po             Oral Care Recommendations: Oral care BID Follow up Recommendations:  (tbd) Plan: Continue with current plan of care     GO                Donavan Burnetamara Casie Sturgeon, MS [facility] SLP [phone]

## 2015-06-20 ENCOUNTER — Inpatient Hospital Stay (HOSPITAL_COMMUNITY): Payer: MEDICAID

## 2015-06-20 LAB — GLUCOSE, CAPILLARY
GLUCOSE-CAPILLARY: 100 mg/dL — AB (ref 65–99)
GLUCOSE-CAPILLARY: 103 mg/dL — AB (ref 65–99)
GLUCOSE-CAPILLARY: 109 mg/dL — AB (ref 65–99)
GLUCOSE-CAPILLARY: 112 mg/dL — AB (ref 65–99)
GLUCOSE-CAPILLARY: 97 mg/dL (ref 65–99)
Glucose-Capillary: 118 mg/dL — ABNORMAL HIGH (ref 65–99)
Glucose-Capillary: 132 mg/dL — ABNORMAL HIGH (ref 65–99)

## 2015-06-20 LAB — BASIC METABOLIC PANEL
Anion gap: 5 (ref 5–15)
BUN: 9 mg/dL (ref 6–20)
CALCIUM: 8 mg/dL — AB (ref 8.9–10.3)
CO2: 27 mmol/L (ref 22–32)
CREATININE: 0.3 mg/dL — AB (ref 0.61–1.24)
Chloride: 110 mmol/L (ref 101–111)
GFR calc Af Amer: >60 mL/min (ref >60)
Glucose, Bld: 122 mg/dL — ABNORMAL HIGH (ref 65–99)
Potassium: 2.5 mmol/L — CL (ref 3.5–5.1)
SODIUM: 142 mmol/L (ref 135–145)

## 2015-06-20 LAB — CBC
HEMATOCRIT: 32.5 % — AB (ref 39.0–52.0)
HEMOGLOBIN: 11.6 g/dL — AB (ref 13.0–17.0)
MCH: 30.2 pg (ref 26.0–34.0)
MCHC: 35.7 g/dL (ref 30.0–36.0)
MCV: 84.6 fL (ref 78.0–100.0)
Platelets: 98 10*3/uL — ABNORMAL LOW (ref 150–400)
RBC: 3.84 MIL/uL — ABNORMAL LOW (ref 4.22–5.81)
RDW: 19.9 % — AB (ref 11.5–15.5)
WBC: 11.9 10*3/uL — AB (ref 4.0–10.5)

## 2015-06-20 MED ORDER — POTASSIUM CHLORIDE 10 MEQ/100ML IV SOLN
INTRAVENOUS | Status: AC
  Filled 2015-06-20: qty 100

## 2015-06-20 MED ORDER — POTASSIUM CHLORIDE 10 MEQ/100ML IV SOLN
10.0000 meq | INTRAVENOUS | Status: AC
  Administered 2015-06-20 (×6): 10 meq via INTRAVENOUS
  Filled 2015-06-20 (×4): qty 100

## 2015-06-20 MED ORDER — DEXTROSE 5 % IV SOLN
2.0000 g | INTRAVENOUS | Status: DC
  Administered 2015-06-20: 2 g via INTRAVENOUS
  Filled 2015-06-20 (×2): qty 2

## 2015-06-20 MED ORDER — KCL IN DEXTROSE-NACL 40-5-0.9 MEQ/L-%-% IV SOLN
INTRAVENOUS | Status: DC
  Administered 2015-06-20: 15:00:00 via INTRAVENOUS
  Filled 2015-06-20 (×3): qty 1000

## 2015-06-20 NOTE — Progress Notes (Signed)
PROGRESS NOTE                                                                                                                                                                                                             Patient Demographics:    Miguel Washington, is a 55 y.o. male, DOB - 08/02/1960, VWU:[id number]  Admit date - 06/17/2015   Admitting Physician Haydee Salter, MD  Outpatient Primary MD for the patient is No PCP Per Patient  LOS - 3  Chief Complaint  Patient presents with  . Fatigue       Brief Narrative  Miguel Washington is a 55 y.o. male patient came to the emergency room with a complaint of weakness. He has been drinking daily a large amount of alcohol. He states that his last drink was the morning of 6/13. Patient drinks more than he eats. Admitted with alcoholic hepatitis, encephalopathy, alcohol intoxication. Began lactulose and Solumedrol.     Subjective:    Miguel Washington is more awake than yesterday and states he is hungry.    Assessment  & Plan :    Principal Problem:   Alcoholic liver disease (HCC) Active Problems:   HYPERTENSION, BENIGN ESSENTIAL   Hyponatremia   Acute alcohol intoxication (HCC)   Hypokalemia   Abnormal LFTs   Hyperbilirubinemia   Prolonged Q-T interval on ECG   Hyperammonemia (HCC)  Alcoholic hepatitis;  -On 06/18/2015 lab work showing a total bilirubin of 21.7, AST of 209, ALT of 47 INR was 1.9 and PTT of 21.3. -He was admitted on 06/17/2015 presented after trying to detox from alcohol for the last 3 days although admitted to drinking alcohol the day of his admission. -Calculated his hepatic discriminate function at 52; greater than 32 indicating severe disease, thus this patient would likely benefit from glucocorticosteroid treatment -Case was discussed with Dr. Randa Evens of GI who agreed, recommended Solumedrol 40 mg IV daily -On 06/19/2015 he appears  to be more awake and interactive. Remains confused. Labs showing modest downward trend in the liver enzymes. Total bilirubin and 22.5. -On 06/20/2015 confusion has improved but not resolved. Will check CMP in the morning and continue current management of Solumedrol.   Alcohol intoxication:  -Patient having history of chronic alcohol abuse, apparently had been try to detox for the past 3 days however presented with  alcohol level of 79. -This was complicated by alcohol induced hepatitis  Acute encephalopathy;  -Suspect secondary to hepatic encephalopathy with labs showing an elevated ammonia level of 124. -During my evaluation he was lethargic, difficult to arouse -Unable to tolerate by mouth intake he has been given lactulose enemas, showing some improvement on 06/19/2015. I don't think he can still tolerate by mouth intake, will continue enemas for now. -Today confusion is improving. Will have Speech therapy evaluate swallowing at bedside to access oral intake. Continue lactulose enemas until he passes swallow test.   Hypoglycemia; Resolved with D 5 fluid replacement. Continue current treatment.    Hypokalemia -A.m. lab work showing potassium of 2.2 on  06/19/15 - 6 runs of potassium chloride on 06/19/15 -Today potassium is 2.5 -Consulted pharmacist about potassium infused fluids for replacement and agreed. Have changed fluid to such.  -Will check a Magnesium level for refractory low potassium  Hypertension Hold home BP medications for now as patient was hypotensive when he first arrived in emergency room  Hyperammonemia Lactulose enema TID. Continue treatment. Switch to oral when he passes swallow test.     Code Status :   Full Code  Family Communication  :   No family in room  Disposition Plan  :   Stay inpatient  Consults  :   Speech therapy  Procedures  :   None  DVT Prophylaxis  :    SCDs   Lab Results  Component Value Date   PLT 98* 06/20/2015     Inpatient Medications  Scheduled Meds: . calcium carbonate  1 tablet Oral Once  . feeding supplement (ENSURE ENLIVE)  237 mL Oral BID BM  . folic acid  1 mg Oral Daily  . lactulose  300 mL Rectal TID  . loratadine  10 mg Oral Daily  . methylPREDNISolone (SOLU-MEDROL) injection  40 mg Intravenous Daily  . multivitamin with minerals  1 tablet Oral Daily  . potassium chloride  20 mEq Oral Once  . sodium chloride flush  3 mL Intravenous Q12H  . thiamine  100 mg Oral Daily   Continuous Infusions: . dextrose 5 % and 0.9 % NaCl with KCl 40 mEq/L     PRN Meds:.LORazepam, oxyCODONE, traZODone  Antibiotics  :    Anti-infectives    None         Objective:   Filed Vitals:   06/19/15 0824 06/19/15 1508 06/19/15 2020 06/20/15 0418  BP: 134/84 136/78 135/76 135/70  Pulse: 102 105 102 111  Temp:  98.6 F (37 C) 97.7 F (36.5 C) 98.5 F (36.9 C)  TempSrc:  Oral Oral Oral  Resp:  20 18 18   Height:      Weight:      SpO2: 100% 92% 100% 100%    Wt Readings from Last 3 Encounters:  06/17/15 69.491 kg (153 lb 3.2 oz)  01/31/15 68.04 kg (150 lb)  12/10/13 73.483 kg (162 lb)     Intake/Output Summary (Last 24 hours) at 06/20/15 1245 Last data filed at 06/20/15 0659  Gross per 24 hour  Intake 1298.33 ml  Output    900 ml  Net 398.33 ml     Physical Exam  General exam: He is arousable, significant icterus Respiratory system: Clear to auscultation. Respiratory effort normal. Cardiovascular system: S1 & S2 heard, RRR. No JVD, murmurs, rubs, gallops or clicks. No pedal edema. Gastrointestinal system: There is some abdominal distention, soft and nontender. No organomegaly or masses felt. Normal bowel sounds  heard. Central nervous system: Alert. . No focal neurological deficits. Extremities: Symmetric 5 x 5 power. Skin: No rashes, lesions or ulcers Psychiatry: mildly confused   Data Review:    CBC  Recent Labs Lab 06/17/15 0016 06/17/15 0620 06/18/15 0455  06/19/15 0335 06/20/15 0426  WBC 8.0 9.2 9.4 9.0 11.9*  HGB 13.2 13.2 12.4* 12.0* 11.6*  HCT 36.2* 37.2* 35.5* 33.3* 32.5*  PLT 111* 99* 74* 70* 98*  MCV 84.0 86.1 85.5 84.7 84.6  MCH 30.6 30.6 29.9 30.5 30.2  MCHC 36.5* 35.5 34.9 36.0 35.7  RDW 19.6* 19.3* 19.1* 19.4* 19.9*    Chemistries   Recent Labs Lab 06/17/15 0016 06/17/15 0104 06/17/15 0620 06/18/15 0455 06/19/15 0335 06/20/15 0426  NA 128*  --  132* 136 139 142  K 2.7*  --  3.7 2.3* 2.2* 2.5*  CL 86*  --  93* 101 108 110  CO2 23  --  19* 26 26 27   GLUCOSE 61*  --  50* 150* 159* 122*  BUN 9  --  9 12 7 9   CREATININE 0.56*  --  0.61 0.60* 0.41* 0.30*  CALCIUM 7.8*  --  7.5* 7.6* 7.9* 8.0*  MG  --  1.8  --   --   --   --   AST 177*  --   --  209* 173*  --   ALT 38  --   --  47 48  --   ALKPHOS 267*  --   --  215* 191*  --   BILITOT 20.4*  --   --  21.7* 22.5*  --    ------------------------------------------------------------------------------------------------------------------ No results for input(s): CHOL, HDL, LDLCALC, TRIG, CHOLHDL, LDLDIRECT in the last 72 hours.  No results found for: HGBA1C ------------------------------------------------------------------------------------------------------------------ No results for input(s): TSH, T4TOTAL, T3FREE, THYROIDAB in the last 72 hours.  Invalid input(s): FREET3 ------------------------------------------------------------------------------------------------------------------ No results for input(s): VITAMINB12, FOLATE, FERRITIN, TIBC, IRON, RETICCTPCT in the last 72 hours.  Coagulation profile  Recent Labs Lab 06/17/15 0620 06/18/15 0455  INR 1.72* 1.93*    No results for input(s): DDIMER in the last 72 hours.  Cardiac Enzymes No results for input(s): CKMB, TROPONINI, MYOGLOBIN in the last 168 hours.  Invalid input(s): CK ------------------------------------------------------------------------------------------------------------------ No results  found for: BNP  Micro Results No results found for this or any previous visit (from the past 240 hour(s)).  Radiology Reports US Abdomen Complete  06/20/2015  CLINICAL DATA:  Cirrhosis, elevated LFTs EXAM: ABDOMEN ULTRASOUND COMPLETE COMPARISON:  06/17/2015 FINDINGS: Gallbladder: Gallbladder is surgically absent Common bile duct: Diameter: 2 mm in diameter within normal limits. Liver: No focal hepatic mass is noted. Again noted increased echogenicity of the liver suspicious for fatty infiltration or chronic liver disease. Small perihepatic ascites. Again noted the reversal of flow in portal vein consistent with portal hypertension IVC: No abnormality visualized. Pancreas: Visualized portion unremarkable. Spleen: Size and appearance within normal limits. Measures 5.7 cm in length Right Kidney: Length: 11.6 cm. Echogenicity within normal limits. No mass or hydronephrosis visualized. Left Kidney: Length: 11 cm. Echogenicity within normal limits. No mass or hydronephrosis visualized. Abdominal aorta: No aneurysm visualized. Other findings: Abdominal ascites noted bilateral lower quadrant. IMPRESSION: 1. Surgical absent gallbladder.  Normal CBD. 2. Again noted diffuse increased echogenicity of the liver suspicious for fatty infiltration or chronic parenchymal liver disease. Again noted reversal of flow portal vein consistent with portal hypertension. 3. No focal hepatic mass. 4. No hydronephrosis. 5. Abdominal ascites is noted. Electronically Signed  By: Natasha MeadLiviu  Pop M.D.   On: 06/20/2015 11:42   Dg Abd Acute W/chest  06/17/2015  CLINICAL DATA:  Jaundice. Upper abdominal pain and nausea for 1 week. Alcohol use. EXAM: DG ABDOMEN ACUTE W/ 1V CHEST COMPARISON:  Chest 01/07/2009.  CT abdomen and pelvis 06/03/2003. FINDINGS: Normal heart size and pulmonary vascularity. Blunting of the right costophrenic angle suggests a small effusion. No focal airspace disease or consolidation in the lungs. No pneumothorax.  Mediastinal contours appear intact. Scattered gas and stool throughout the colon. No small or large bowel distention. No free intra-abdominal air. No abnormal air-fluid levels. Surgical clips in the right upper quadrant. Calcified phleboliths in the pelvis. No radiopaque stones. Visualized bones appear intact. IMPRESSION: Probable small right pleural effusion. Normal nonobstructive bowel gas pattern. Electronically Signed   By: Burman NievesWilliam  Stevens M.D.   On: 06/17/2015 02:50   [place] Abdomen Limited Ruq  06/17/2015  CLINICAL DATA:  Elevated liver enzymes EXAM: US ABDOMEN LIMITED - RIGHT UPPER QUADRANT COMPARISON:  August 15, 2013 FINDINGS: Gallbladder: Surgically absent. Common bile duct: Diameter: 3 mm. No intrahepatic or extrahepatic biliary duct dilatation. Liver: No focal lesion identified. The liver echogenicity overall is increased. Flow in the portal vein is hepatofugal, opposite of normal anatomic direction. A small amount of ascites is noted around the liver. IMPRESSION: Evidence of portal venous hypertension with reversal of flow in the portal vein. The liver has an appearance consistent with hepatic steatosis and likely underlying parenchymal liver disease. While no focal liver lesions are identified, the sensitivity of ultrasound for focal liver lesions is diminished significantly in this circumstance. A small amount of ascites surrounding the liver is noted. Gallbladder is absent. No biliary duct dilatation evident. Electronically Signed   By: Bretta BangWilliam  Woodruff III M.D.   On: 06/17/2015 07:24    Time Spent in minutes  30   Debbra RidingJason Whitaker PA-S on 06/20/2015 at 12:45 PM  Addendum  I personally evaluated Mr Miguel Washington on 06/20/2015 and agree with the above findings. He has shown gradual clinical clinical improvement. Laboratory data showing upward trend in his white count from 9000-11,900. I'm concerned about the possibility of spontaneous bacterial peritonitis. I spoke to Dr. Randa EvensEdwards of GI who felt it  would be reasonable to treat empirically with antibiotics. Abdominal ultrasound that was done today shows evidence of ascites. His INR is 1.9 and would likely require reversal prior to performing ultrasound-guided paracentesis. Will initiate empiric IV antibiotic therapy with ceftriaxone 1 g IV every 24 hours. On physical examination he remains jaundiced. He did not have pain to palpation on abdominal exam. Lungs remain clear. Labs reviewed, remains hypokalemic with potassium of 2.5, discussed with pharmacy will replace with IV potassium. With regard to hepatic encephalopathy he has been treated with lactulose enemas given inability to take by mouth. His confusion has waxed and waned and at times appeared to be more lucid and able to take by mouth. Will reassess this afternoon, attempt to advance diet and transition to oral lactulose if he can tolerate by mouth. Plan to repeat a comprehensive metabolic panel and INR/PT in a.m.

## 2015-06-20 NOTE — Evaluation (Addendum)
Clinical/Bedside Swallow Evaluation Patient Details  Name: Miguel Washington MRN: [id number] Date of Birth: Jan 07, 1960  Today's Date: 06/20/2015 Time: SLP Start Time (ACUTE ONLY): 1200 SLP Stop Time (ACUTE ONLY): 1235 SLP Time Calculation (min) (ACUTE ONLY): 35 min  Past Medical History:  Past Medical History  Diagnosis Date  . Hypertension   . Anxiety    Past Surgical History:  Past Surgical History  Procedure Laterality Date  . Cholecystectomy     HPI:  55 yo male adm to [facility] with AMS - alcoholic liver disease.  PMH + for ETOH use, HTN, hypokalemia.  Pt found to have probable right pleural effusion.  Referral ordered due to pt holding pills and not swallowing.  RN reports pt received medicine last pm but required siginficant coaxing.     Assessment / Plan / Recommendation Clinical Impression  The evening following ST follow up visit yesterday (06/19/15), MD was notified that pt was having difficulty swallowing, and was made NPO. MD requested repeat BSE to determine appropriateness for po intake. No focal CN deficits noted during assessment of oral motor strength and function. Pt exhibited adequate (strong, equal) grips, strong cough, and adequate laryngeal elevation with volitional swallow. Pt was presented with ice chips and water, and tolerated these consistencies without delay or overt s/s aspiration. When Puree was first presented, pt required cueing to close his lips and take the bolus off the spoon. He then demonstrated oral holding for almost a minute at which time SLP cued him to go ahead and swallow.  Pt took 30 seconds to initiate the swallow. Pt required physical assistance to get the spoon to his mouth to self feed puree.  Pt was offered a graham cracker, and exhibited weak, ataxic movements trying to take the cracker from SLP, and required physical assistance to raise it to his mouth. Pt popped cracker in his mouth and chewed appropriately, with no delay of swallow or overt s/s  aspiration. Recommend dys 2 diet and thin liquids given inconsistent presentation at bedside. Pt will require assistance with feeding, and should be fed only when he is alert and responsive enough to do so appropriately. Pt's deficits are anticipated to be due to encephalopathy, and are cognitively based.  His appropriateness for po intake may continue to be highly variable. ST will follow for diet tolerance and readiness to advance. Safe swallow precautions posted in pt room.  Also provided written behavioral/dietary suggestions for managing esophageal dysmotility, per pt report of globus sensation at home.    Recommend consideration of regular barium swallow to evaluate esophageal motility    Aspiration Risk  Mild aspiration risk    Diet Recommendation Dysphagia 2 (Fine chop);Thin liquid   Liquid Administration via: Straw;Cup Medication Administration: Whole meds with puree Supervision: Staff to assist with self feeding;Full supervision/cueing for compensatory strategies Compensations: Small sips/bites;Slow rate;Lingual sweep for clearance of pocketing Postural Changes: Seated upright at 90 degrees;Remain upright for at least 30 minutes after po intake    Other  Recommendations Oral Care Recommendations: Oral care BID   Follow up Recommendations   (tbd)    Frequency and Duration min 2x/week  1 week       Prognosis Prognosis for Safe Diet Advancement: Fair Barriers to Reach Goals: Cognitive deficits      Swallow Study   General Date of Onset: 06/18/15 HPI: 55 yo male adm to [facility] with AMS - alcoholic liver disease.  PMH + for ETOH use, HTN, hypokalemia.  Pt found to have probable right  pleural effusion.  Referral ordered due to pt holding pills and not swallowing.  RN reports pt received medicine last pm but required siginficant coaxing.   Type of Study: Bedside Swallow Evaluation Previous Swallow Assessment: BSE 06/18/15 - recommendation for dys 3/thin liquids Diet Prior to this  Study: NPO Temperature Spikes Noted: No (afebrile since admit) Respiratory Status: Room air History of Recent Intubation: No Behavior/Cognition: Alert;Confused;Requires cueing;Distractible Oral Cavity Assessment: Within Functional Limits Oral Care Completed by SLP: No Oral Cavity - Dentition: Adequate natural dentition Self-Feeding Abilities: Total assist Patient Positioning: Upright in bed Baseline Vocal Quality: Normal Volitional Cough: Strong Volitional Swallow: Able to elicit    Oral/Motor/Sensory Function Overall Oral Motor/Sensory Function: Within functional limits   Ice Chips Ice chips: Not tested Presentation: Spoon   Thin Liquid Thin Liquid: Within functional limits Presentation: Straw    Nectar Thick Nectar Thick Liquid: Not tested   Honey Thick Honey Thick Liquid: Not tested   Puree Puree: Within functional limits Presentation: Spoon Oral Phase Impairments: Poor awareness of bolus (cues required for pt to close his mouth around spoon) Oral Phase Functional Implications: Prolonged oral transit;Oral holding Pharyngeal Phase Impairments: Suspected delayed Swallow   Solid    Solid: Within functional limits Presentation: Self Fed       Murvin NatalBueche, Ruffin Pyoelia Brown 06/20/2015,12:56 PM  Harlow Asaelia B. Murvin NatalBueche, [facility] CenterMSP, CCC-SLP [phone]

## 2015-06-20 NOTE — Progress Notes (Signed)
CRITICAL VALUE ALERT  Critical value received:  Potassium 2.5  Date of notification:  06/20/2015  Time of notification:  0519  Critical value read back:Yes.    Nurse who received alert:  Driscilla MoatsBriana Holt, RN  MD notified (1st page):  Donnamarie PoagK. Kirby, NP  Time of first page:  0520  MD notified (2nd page):  Time of second page:  Responding MD:  Donnamarie PoagK. Kirby, NP  Time MD responded:  [phone]

## 2015-06-21 DIAGNOSIS — K652 Spontaneous bacterial peritonitis: Secondary | ICD-10-CM

## 2015-06-21 DIAGNOSIS — K7011 Alcoholic hepatitis with ascites: Principal | ICD-10-CM

## 2015-06-21 LAB — COMPREHENSIVE METABOLIC PANEL
ALK PHOS: 207 U/L — AB (ref 38–126)
ALT: 49 U/L (ref 17–63)
ANION GAP: 6 (ref 5–15)
AST: 119 U/L — ABNORMAL HIGH (ref 15–41)
Albumin: 1.8 g/dL — ABNORMAL LOW (ref 3.5–5.0)
BILIRUBIN TOTAL: 25.5 mg/dL — AB (ref 0.3–1.2)
BUN: 11 mg/dL (ref 6–20)
CALCIUM: 7.9 mg/dL — AB (ref 8.9–10.3)
CO2: 26 mmol/L (ref 22–32)
CREATININE: 0.36 mg/dL — AB (ref 0.61–1.24)
Chloride: 111 mmol/L (ref 101–111)
Glucose, Bld: 110 mg/dL — ABNORMAL HIGH (ref 65–99)
Potassium: 3.2 mmol/L — ABNORMAL LOW (ref 3.5–5.1)
Sodium: 143 mmol/L (ref 135–145)
TOTAL PROTEIN: 6.1 g/dL — AB (ref 6.5–8.1)

## 2015-06-21 LAB — GLUCOSE, CAPILLARY
GLUCOSE-CAPILLARY: 90 mg/dL (ref 65–99)
GLUCOSE-CAPILLARY: 112 mg/dL — AB (ref 65–99)
Glucose-Capillary: 99 mg/dL (ref 65–99)
Glucose-Capillary: 123 mg/dL — ABNORMAL HIGH (ref 65–99)
Glucose-Capillary: 107 mg/dL — ABNORMAL HIGH (ref 65–99)

## 2015-06-21 LAB — MAGNESIUM: Magnesium: 1.8 mg/dL (ref 1.7–2.4)

## 2015-06-21 LAB — PROTIME-INR
INR: 2.06 — AB (ref 0.00–1.49)
Prothrombin Time: 22.4 seconds — ABNORMAL HIGH (ref 11.6–15.2)

## 2015-06-21 MED ORDER — PREDNISOLONE 5 MG PO TABS
40.0000 mg | ORAL_TABLET | Freq: Every day | ORAL | Status: DC
  Administered 2015-06-21 – 2015-06-30 (×10): 40 mg via ORAL
  Filled 2015-06-21 (×10): qty 8

## 2015-06-21 MED ORDER — SPIRONOLACTONE 25 MG PO TABS
25.0000 mg | ORAL_TABLET | Freq: Every day | ORAL | Status: DC
  Administered 2015-06-22: 25 mg via ORAL

## 2015-06-21 MED ORDER — CEFUROXIME AXETIL 500 MG PO TABS
500.0000 mg | ORAL_TABLET | Freq: Two times a day (BID) | ORAL | Status: DC
  Administered 2015-06-21 – 2015-06-23 (×5): 500 mg via ORAL
  Filled 2015-06-21 (×8): qty 1

## 2015-06-21 MED ORDER — POTASSIUM CHLORIDE CRYS ER 20 MEQ PO TBCR
40.0000 meq | EXTENDED_RELEASE_TABLET | ORAL | Status: AC
  Administered 2015-06-21 (×3): 40 meq via ORAL
  Filled 2015-06-21 (×3): qty 2

## 2015-06-21 MED ORDER — LACTULOSE 10 GM/15ML PO SOLN
20.0000 g | Freq: Three times a day (TID) | ORAL | Status: DC
  Administered 2015-06-21 – 2015-06-30 (×28): 20 g via ORAL
  Filled 2015-06-21 (×27): qty 30

## 2015-06-21 NOTE — Progress Notes (Signed)
PROGRESS NOTE                                                                                                                                                                                                             Patient Demographics:    Miguel Washington, is a 55 y.o. male, DOB - [birth year], ZOX:[id number]  Admit date - 06/17/2015   Admitting Physician Haydee SalterPhillip M Hobbs, MD  Outpatient Primary MD for the patient is No PCP Per Patient  LOS - 4  Chief Complaint  Patient presents with  . Fatigue       Brief Narrative  Miguel BottomsChristopher Turton is a 55 y.o. male patient came to the emergency room with a complaint of weakness. He has been drinking daily a large amount of alcohol. He states that his last drink was the morning of 6/13. Patient drinks more than he eats. Admitted with alcoholic hepatitis, encephalopathy, alcohol intoxication. Began lactulose and Steroids   Subjective:    Miguel BottomsChristopher Leiber is looking much better today, he is more awake and alert, oriented 3   Assessment  & Plan :    Principal Problem:   Alcoholic liver disease (HCC) Active Problems:   HYPERTENSION, BENIGN ESSENTIAL   Hyponatremia   Acute alcohol intoxication (HCC)   Hypokalemia   Abnormal LFTs   Hyperbilirubinemia   Prolonged Q-T interval on ECG   Hyperammonemia (HCC)  Alcoholic hepatitis;  -On 06/18/2015 lab work showing a total bilirubin of 21.7, AST of 209, ALT of 47 INR was 1.9 and PTT of 21.3. -He was admitted on 06/17/2015 presented after trying to detox from alcohol for the last 3 days although admitted to drinking alcohol the day of his admission. -Calculated his hepatic discriminate function at 52; greater than 32 indicating severe disease, thus this patient would likely benefit from glucocorticosteroid treatment -Case was discussed with Dr. Randa EvensEdwards of GI who agreed, recommended Solumedrol 40 mg IV daily -Transaminases  gradually trending down -06/21/2015 he was able tolerate by mouth for which he was switched to prednisolone 40 mg by mouth daily with discontinuation of Solu-Medrol. -Abdominal ultrasound revealed presence of ascites. Started spironolactone 50 mg by mouth daily and lasix 20 mg PO q daily.   Alcohol intoxication:  -Patient having history of chronic alcohol abuse, apparently had been try to detox for the past 3 days however  presented with alcohol level of 79. -This was complicated by alcohol induced hepatitis -He does not have evidence of delirium tremens  Acute encephalopathy;  -Suspect secondary to hepatic encephalopathy with labs showing an elevated ammonia level of 124. -During my evaluation he was lethargic, difficult to arouse -Unable to tolerate by mouth intake he has been given lactulose enemas, showing some improvement on 06/19/2015. I don't think he can still tolerate by mouth intake, will continue enemas for now. -On 06/21/2015 is showing steady clinical improvement, was more awake and alert today and able to tell me this is [facility] in [place], [place], [place]. Plan to switch him to oral lactulose.   Suspected spontaneous bacterial peritonitis  -Lab work showed elevated white count of 11,900 -Although he did not have tender abdomen he did present with significant mental status changes. -Initially treated with empiric IV antibiotic therapy with ceftriaxone every 24 hours. -Given clinical improvement, was changed to Ceftin 500 mg by mouth twice a day on 06/21/2015  Hypokalemia -A.m. lab work showing potassium of 2.2 on  06/19/15 - 6 runs of potassium chloride on 06/19/15 -Potassium getting better at 3.2 (from 2.5 on yesterday's lab work) -Continue replacement with oral potassium. Also started spironolactone  Hypertension -Starting diuretic therapy today  Hyperammonemia -Now tolerating by mouth intake, stop lactulose enemas and place him on oral  lactulose TID   Code Status :   Full Code  Family Communication  :   I spoke to his wife and brother  Disposition Plan  :   Getting better anticipate discharge in the next 48-72 hours  Consults  :   Speech therapy  Procedures  :   None  DVT Prophylaxis  :    SCDs   Lab Results  Component Value Date   PLT 98* 06/20/2015    Inpatient Medications  Scheduled Meds: . calcium carbonate  1 tablet Oral Once  . cefTRIAXone (ROCEPHIN)  IV  2 g Intravenous Q24H  . feeding supplement (ENSURE ENLIVE)  237 mL Oral BID BM  . folic acid  1 mg Oral Daily  . lactulose  20 g Oral TID  . loratadine  10 mg Oral Daily  . multivitamin with minerals  1 tablet Oral Daily  . potassium chloride  40 mEq Oral Q4H  . prednisoLONE  40 mg Oral Daily  . sodium chloride flush  3 mL Intravenous Q12H  . thiamine  100 mg Oral Daily   Continuous Infusions:   PRN Meds:.LORazepam, oxyCODONE, traZODone  Antibiotics  :    Anti-infectives    Start     Dose/Rate Route Frequency Ordered Stop   06/20/15 1600  cefTRIAXone (ROCEPHIN) 2 g in dextrose 5 % 50 mL IVPB     2 g 100 mL/hr over 30 Minutes Intravenous Every 24 hours 06/20/15 1514           Objective:   Filed Vitals:   06/20/15 0418 06/20/15 1403 06/20/15 2214 06/21/15 0545  BP: 135/70 151/80 157/93 155/87  Pulse: 111 114 116 96  Temp: 98.5 F (36.9 C) 98.7 F (37.1 C) 98 F (36.7 C) 99.2 F (37.3 C)  TempSrc: Oral Oral Axillary Oral  Resp: Height:      Weight:      SpO2: 100% 100% 100% 98%    Wt Readings from Last 3 Encounters:  06/17/15 69.491 kg (153 lb 3.2 oz)  01/31/15 68.04 kg (150 lb)  12/10/13 73.483 kg (162 lb)  Intake/Output Summary (Last 24 hours) at 06/21/15 1330 Last data filed at 06/21/15 0900  Gross per 24 hour  Intake   2205 ml  Output   1552 ml  Net    653 ml     Physical Exam  General exam: He is more awake and alert, oriented, significant icterus Respiratory system: Clear to  auscultation. Respiratory effort normal. Cardiovascular system: S1 & S2 heard, RRR. No JVD, murmurs, rubs, gallops or clicks. No pedal edema. Gastrointestinal system: There is some abdominal distention, soft and nontender. No organomegaly or masses felt. Normal bowel sounds heard. Central nervous system: Alert. . No focal neurological deficits. Extremities: Symmetric 5 x 5 power. Skin: No rashes, lesions or ulcers Psychiatry: mildly confused   Data Review:    CBC  Recent Labs Lab 06/17/15 0016 06/17/15 0620 06/18/15 0455 06/19/15 0335 06/20/15 0426  WBC 8.0 9.2 9.4 9.0 11.9*  HGB 13.2 13.2 12.4* 12.0* 11.6*  HCT 36.2* 37.2* 35.5* 33.3* 32.5*  PLT 111* 99* 74* 70* 98*  MCV 84.0 86.1 85.5 84.7 84.6  MCH 30.6 30.6 29.9 30.5 30.2  MCHC 36.5* 35.5 34.9 36.0 35.7  RDW 19.6* 19.3* 19.1* 19.4* 19.9*    Chemistries   Recent Labs Lab 06/17/15 0016 06/17/15 0104 06/17/15 0620 06/18/15 0455 06/19/15 0335 06/20/15 0426 06/21/15 0456  NA 128*  --  132* 136 139 142 143  K 2.7*  --  3.7 2.3* 2.2* 2.5* 3.2*  CL 86*  --  93* 101 108 110 111  CO2 23  --  19* 26 26 27 26   GLUCOSE 61*  --  50* 150* 159* 122* 110*  BUN 9  --  9 12 7 9 11   CREATININE 0.56*  --  0.61 0.60* 0.41* 0.30* 0.36*  CALCIUM 7.8*  --  7.5* 7.6* 7.9* 8.0* 7.9*  MG  --  1.8  --   --   --   --  1.8  AST 177*  --   --  209* 173*  --  119*  ALT 38  --   --  47 48  --  49  ALKPHOS 267*  --   --  215* 191*  --  207*  BILITOT 20.4*  --   --  21.7* 22.5*  --  25.5*   ------------------------------------------------------------------------------------------------------------------ No results for input(s): CHOL, HDL, LDLCALC, TRIG, CHOLHDL, LDLDIRECT in the last 72 hours.  No results found for: HGBA1C ------------------------------------------------------------------------------------------------------------------ No results for input(s): TSH, T4TOTAL, T3FREE, THYROIDAB in the last 72 hours.  Invalid input(s):  FREET3 ------------------------------------------------------------------------------------------------------------------ No results for input(s): VITAMINB12, FOLATE, FERRITIN, TIBC, IRON, RETICCTPCT in the last 72 hours.  Coagulation profile  Recent Labs Lab 06/17/15 0620 06/18/15 0455 06/21/15 0456  INR 1.72* 1.93* 2.06*    No results for input(s): DDIMER in the last 72 hours.  Cardiac Enzymes No results for input(s): CKMB, TROPONINI, MYOGLOBIN in the last 168 hours.  Invalid input(s): CK ------------------------------------------------------------------------------------------------------------------ No results found for: BNP  Micro Results No results found for this or any previous visit (from the past 240 hour(s)).  Radiology Reports US Abdomen Complete  06/20/2015  CLINICAL DATA:  Cirrhosis, elevated LFTs EXAM: ABDOMEN ULTRASOUND COMPLETE COMPARISON:  06/17/2015 FINDINGS: Gallbladder: Gallbladder is surgically absent Common bile duct: Diameter: 2 mm in diameter within normal limits. Liver: No focal hepatic mass is noted. Again noted increased echogenicity of the liver suspicious for fatty infiltration or chronic liver disease. Small perihepatic ascites. Again noted the reversal of flow in portal vein consistent with  portal hypertension IVC: No abnormality visualized. Pancreas: Visualized portion unremarkable. Spleen: Size and appearance within normal limits. Measures 5.7 cm in length Right Kidney: Length: 11.6 cm. Echogenicity within normal limits. No mass or hydronephrosis visualized. Left Kidney: Length: 11 cm. Echogenicity within normal limits. No mass or hydronephrosis visualized. Abdominal aorta: No aneurysm visualized. Other findings: Abdominal ascites noted bilateral lower quadrant. IMPRESSION: 1. Surgical absent gallbladder.  Normal CBD. 2. Again noted diffuse increased echogenicity of the liver suspicious for fatty infiltration or chronic parenchymal liver disease. Again  noted reversal of flow portal vein consistent with portal hypertension. 3. No focal hepatic mass. 4. No hydronephrosis. 5. Abdominal ascites is noted. Electronically Signed   By: Natasha Mead M.D.   On: 06/20/2015 11:42   Dg Abd Acute W/chest  06/17/2015  CLINICAL DATA:  Jaundice. Upper abdominal pain and nausea for 1 week. Alcohol use. EXAM: DG ABDOMEN ACUTE W/ 1V CHEST COMPARISON:  Chest 01/07/2009.  CT abdomen and pelvis 06/03/2003. FINDINGS: Normal heart size and pulmonary vascularity. Blunting of the right costophrenic angle suggests a small effusion. No focal airspace disease or consolidation in the lungs. No pneumothorax. Mediastinal contours appear intact. Scattered gas and stool throughout the colon. No small or large bowel distention. No free intra-abdominal air. No abnormal air-fluid levels. Surgical clips in the right upper quadrant. Calcified phleboliths in the pelvis. No radiopaque stones. Visualized bones appear intact. IMPRESSION: Probable small right pleural effusion. Normal nonobstructive bowel gas pattern. Electronically Signed   By: Burman Nieves M.D.   On: 06/17/2015 02:50   US Abdomen Limited Ruq  06/17/2015  CLINICAL DATA:  Elevated liver enzymes EXAM: US ABDOMEN LIMITED - RIGHT UPPER QUADRANT COMPARISON:  August 15, 2013 FINDINGS: Gallbladder: Surgically absent. Common bile duct: Diameter: 3 mm. No intrahepatic or extrahepatic biliary duct dilatation. Liver: No focal lesion identified. The liver echogenicity overall is increased. Flow in the portal vein is hepatofugal, opposite of normal anatomic direction. A small amount of ascites is noted around the liver. IMPRESSION: Evidence of portal venous hypertension with reversal of flow in the portal vein. The liver has an appearance consistent with hepatic steatosis and likely underlying parenchymal liver disease. While no focal liver lesions are identified, the sensitivity of ultrasound for focal liver lesions is diminished significantly  in this circumstance. A small amount of ascites surrounding the liver is noted. Gallbladder is absent. No biliary duct dilatation evident. Electronically Signed   By: Bretta Bang III M.D.   On: 06/17/2015 07:24    Time Spent in minutes  35   Valerie Cones PA-S on 06/21/2015 at 1:30 PM

## 2015-06-21 NOTE — Progress Notes (Signed)
PT Cancellation Note  Patient Details Name: Miguel Washington MRN: [id number] DOB: Jan 14, 1960   Cancelled Treatment:     PT order received but eval deferred 2* pt c/o fatigue.  Pt states very tired after walking with Dr and Charity fundraiserN.  Will follow.   Kenlyn Lose 06/21/2015, 4:23 PM

## 2015-06-21 NOTE — Progress Notes (Signed)
CRITICAL VALUE ALERT  Critical value received: Total Bilirubin 25.5  Date of notification:  06/21/15  Time of notification:  0550  Critical value read back:Yes   Nurse who received alert:  Governor SpeckingJerry Sem Mccaughey RN   MD notified (1st page):  Burnadette PeterLynch   Time of first page:  0554  MD notified (2nd page):  Time of second page:  Responding MD:    Time MD responded:

## 2015-06-22 DIAGNOSIS — E440 Moderate protein-calorie malnutrition: Secondary | ICD-10-CM | POA: Diagnosis present

## 2015-06-22 LAB — GLUCOSE, CAPILLARY
GLUCOSE-CAPILLARY: 64 mg/dL — AB (ref 65–99)
GLUCOSE-CAPILLARY: 68 mg/dL (ref 65–99)
GLUCOSE-CAPILLARY: 88 mg/dL (ref 65–99)
GLUCOSE-CAPILLARY: 93 mg/dL (ref 65–99)
GLUCOSE-CAPILLARY: 82 mg/dL (ref 65–99)
Glucose-Capillary: 104 mg/dL — ABNORMAL HIGH (ref 65–99)
Glucose-Capillary: 87 mg/dL (ref 65–99)
Glucose-Capillary: 76 mg/dL (ref 65–99)
Glucose-Capillary: 95 mg/dL (ref 65–99)

## 2015-06-22 LAB — COMPREHENSIVE METABOLIC PANEL
ALBUMIN: 1.9 g/dL — AB (ref 3.5–5.0)
ALT: 56 U/L (ref 17–63)
ANION GAP: 6 (ref 5–15)
AST: 114 U/L — ABNORMAL HIGH (ref 15–41)
Alkaline Phosphatase: 210 U/L — ABNORMAL HIGH (ref 38–126)
BUN: 16 mg/dL (ref 6–20)
CHLORIDE: 107 mmol/L (ref 101–111)
CO2: 26 mmol/L (ref 22–32)
Calcium: 8 mg/dL — ABNORMAL LOW (ref 8.9–10.3)
Creatinine, Ser: 0.34 mg/dL — ABNORMAL LOW (ref 0.61–1.24)
GFR calc non Af Amer: >60 mL/min (ref >60)
GLUCOSE: 89 mg/dL (ref 65–99)
Potassium: 3.8 mmol/L (ref 3.5–5.1)
Sodium: 139 mmol/L (ref 135–145)
Total Bilirubin: 29.2 mg/dL (ref 0.3–1.2)
Total Protein: 6.6 g/dL (ref 6.5–8.1)

## 2015-06-22 LAB — CBC
HEMATOCRIT: 35 % — AB (ref 39.0–52.0)
HEMOGLOBIN: 12.3 g/dL — AB (ref 13.0–17.0)
MCH: 30.4 pg (ref 26.0–34.0)
MCHC: 35.1 g/dL (ref 30.0–36.0)
MCV: 86.6 fL (ref 78.0–100.0)
Platelets: 116 10*3/uL — ABNORMAL LOW (ref 150–400)
RBC: 4.04 MIL/uL — AB (ref 4.22–5.81)
RDW: 21.8 % — ABNORMAL HIGH (ref 11.5–15.5)
WBC: 14.6 10*3/uL — ABNORMAL HIGH (ref 4.0–10.5)

## 2015-06-22 MED ORDER — SPIRONOLACTONE 25 MG PO TABS
50.0000 mg | ORAL_TABLET | Freq: Every day | ORAL | Status: DC
  Administered 2015-06-23 – 2015-06-27 (×5): 50 mg via ORAL
  Filled 2015-06-22 (×7): qty 2

## 2015-06-22 MED ORDER — DEXTROSE 50 % IV SOLN
INTRAVENOUS | Status: AC
  Administered 2015-06-22: 25 mL
  Filled 2015-06-22: qty 50

## 2015-06-22 MED ORDER — FUROSEMIDE 20 MG PO TABS
20.0000 mg | ORAL_TABLET | Freq: Every day | ORAL | Status: DC
  Administered 2015-06-22 – 2015-06-27 (×6): 20 mg via ORAL
  Filled 2015-06-22 (×6): qty 1

## 2015-06-22 NOTE — Evaluation (Signed)
Physical Therapy Evaluation Patient Details Name: Miguel BottomsChristopher Washington MRN: [id number] DOB: Aug 27, 1960 Today's Date: 06/22/2015   History of Present Illness  Miguel BottomsChristopher Davidow is a 55 y.o. male patient came to the emergency room with a complaint of weakness. He has been drinking daily a large amount of alcohol. He states that his last drink was the morning of 6/13. Patient drinks more than he eats. Admitted with alcoholic hepatitis, encephalopathy, alcohol intoxication  Clinical Impression  Pt admitted with above diagnosis. Pt currently with functional limitations due to the deficits listed below (see PT Problem List).  Pt will benefit from skilled PT to increase their independence and safety with mobility to allow discharge to the venue listed below.  unsteady gait today-amb 1855' with RW and min assist, should progress fairly well, will follow and assess for needs--likely will not need f/u PT     Follow Up Recommendations No PT follow up    Equipment Recommendations  Other (comment) (TBA-depending on progress--may need RW)    Recommendations for Other Services       Precautions / Restrictions Precautions Precautions: Fall      Mobility  Bed Mobility Overal bed mobility: Needs Assistance Bed Mobility: Supine to Sit;Sit to Supine     Supine to sit: Supervision;HOB elevated Sit to supine: Supervision   General bed mobility comments: incr time, cues for task completion  Transfers Overall transfer level: Needs assistance Equipment used: Rolling walker (2 wheeled) Transfers: Sit to/from Stand Sit to Stand: Min assist         General transfer comment: cues for hand placement and to  control descent  Ambulation/Gait Ambulation/Gait assistance: Min assist Ambulation Distance (Feet): 55 Feet Assistive device: Rolling walker (2 wheeled) Gait Pattern/deviations: Step-through pattern;Decreased stride length;Trunk flexed   Gait velocity interpretation: Below normal speed for  age/gender General Gait Details: assist for balance and to maneuver RW at times; pt is shaky and unsteady throughout distance above  Stairs            Wheelchair Mobility    Modified Rankin (Stroke Patients Only)       Balance Overall balance assessment: Needs assistance   Sitting balance-Leahy Scale: Fair       Standing balance-Leahy Scale: Poor                               Pertinent Vitals/Pain Pain Assessment: No/denies pain    Home Living Family/patient expects to be discharged to:: Private residence Living Arrangements: Spouse/significant other Available Help at Discharge: Family;Available 24 hours/day Type of Home: House Home Access: Stairs to enter   Enter[employer] CorporationEntrance Stairs-Number of Steps: 3-4 Home Layout: One level Home Equipment: None      Prior Function Level of Independence: Independent               Hand Dominance        Extremity/Trunk Assessment   Upper Extremity Assessment: Overall WFL for tasks assessed           Lower Extremity Assessment: Overall WFL for tasks assessed         Communication   Communication: No difficulties  Cognition Arousal/Alertness: Awake/alert Behavior During Therapy: WFL for tasks assessed/performed Overall Cognitive Status: Within Functional Limits for tasks assessed                      General Comments      Exercises  Assessment/Plan    PT Assessment Patient needs continued PT services  PT Diagnosis Difficulty walking   PT Problem List Decreased balance;Decreased mobility;Decreased activity tolerance  PT Treatment Interventions DME instruction;Gait training;Functional mobility training;Therapeutic activities;Therapeutic exercise;Patient/family education;Balance training   PT Goals (Current goals can be found in the Care Plan section) Acute Rehab PT Goals Patient Stated Goal: none stated, wants to rest PT Goal Formulation: With patient Time For Goal Achievement:  07-03-2015 Potential to Achieve Goals: Good    Frequency Min 3X/week   Barriers to discharge        Co-evaluation               End of Session Equipment Utilized During Treatment: Gait belt Activity Tolerance: Patient limited by fatigue Patient left: in bed;with call bell/phone within reach;with bed alarm set Nurse Communication: Mobility status         Time: 1308-6578 PT Time Calculation (min) (ACUTE ONLY): 12 min   Charges:   PT Evaluation $PT Eval Low Complexity: 1 Procedure     PT G Codes:        Jowana Thumma 07-03-2015, 11:24 AM

## 2015-06-22 NOTE — Progress Notes (Signed)
Pt had CBG of 76 at 8am. Refused breakfast and CBG rechecked at 9am and was 68. Given juice and peanut butter cracker. CBG at 915 was 64. Dextrose 25ml given and recheck CBG was 88. Pt asymptomatic the entire time. Melton Alarana A Dohn Stclair, RN

## 2015-06-22 NOTE — Progress Notes (Signed)
PROGRESS NOTE                                                                                                                                                                                                             Patient Demographics:    Miguel Washington, is a 55 y.o. male, DOB - 03/31/1960, ZOX:[id number]  Admit date - 06/17/2015   Admitting Physician Haydee Salter, MD  Outpatient Primary MD for the patient is No PCP Per Patient  LOS - 5  Chief Complaint  Patient presents with  . Fatigue       Brief Narrative  Miguel Washington is a 54 y.o. male patient came to the emergency room with a complaint of weakness. He has been drinking daily a large amount of alcohol. He states that his last drink was the morning of 6/13. Patient drinks more than he eats. Admitted with alcoholic hepatitis, encephalopathy, alcohol intoxication. Began lactulose and Steroids   Subjective:    Miguel Washington feeling okay, does have bowel movement this morning, alert and awake oriented 3.   Assessment  & Plan :    Principal Problem:   Alcoholic liver disease (HCC) Active Problems:   HYPERTENSION, BENIGN ESSENTIAL   Hyponatremia   Acute alcohol intoxication (HCC)   Hypokalemia   Abnormal LFTs   Hyperbilirubinemia   Prolonged Q-T interval on ECG   Hyperammonemia (HCC)   Malnutrition of moderate degree   Alcoholic hepatitis;  -On 06/18/2015 lab work showing a total bilirubin of 21.7, AST of 209, ALT of 47 INR was 1.9 and PTT of 21.3. -He was admitted on 06/17/2015 presented after trying to detox from alcohol for the last 3 days although admitted to drinking alcohol the day of his admission. -Maddrey's hepatic discriminant function at 63.7; greater than 32 indicating severe disease, thus this patient would likely benefit from glucocorticosteroid treatment -Case was discussed with Dr. Randa Evens of GI who agreed,  recommended Solumedrol 40 mg IV daily -Transaminases gradually trending down -06/21/2015 he was able tolerate by mouth for which he was switched to prednisolone 40 mg by mouth daily with discontinuation of Solu-Medrol. -Abdominal ultrasound revealed presence of ascites. Started spironolactone 50 mg by mouth daily and lasix 20 mg PO q daily. -I will continue steroids for now, if not improving will ask GI for advise.  Alcohol intoxication:  -  Patient having history of chronic alcohol abuse, apparently had been try to detox for the past 3 days however presented with alcohol level of 79. -This was complicated by alcohol induced hepatitis -He does not have evidence of delirium tremens  Acute encephalopathy;  -Suspect secondary to hepatic encephalopathy with labs showing an elevated ammonia level of 124. -During my evaluation he was lethargic, difficult to arouse -Initially he was not able to tolerate oral intake, given lactulose enemas. -On 06/21/2015 more awake and alert, was able to tolerate oral diet placed on oral lactulose.  Suspected spontaneous bacterial peritonitis  -Lab work showed elevated white count of 11,900 -Although he did not have tender abdomen he did present with significant mental status changes. -Initially treated with empiric IV antibiotic therapy with ceftriaxone every 24 hours. -Given clinical improvement, was changed to Ceftin 500 mg by mouth twice a day on 06/21/2015  Hypokalemia -A.m. lab work showing potassium of 2.2 on  06/19/15 -This is repleted with oral and IV supplements, back to normal, spironolactone started.  Hypertension -Starting diuretic therapy today  Hyperammonemia -Now tolerating by mouth intake, stop lactulose enemas and place him on oral lactulose TID   Code Status :   Full Code  Family Communication  :   I spoke to his wife and brother  Disposition Plan  :   Getting better anticipate discharge in the next 48-72 hours  Consults  :     Speech therapy  Procedures  :   None  DVT Prophylaxis  :    SCDs   Lab Results  Component Value Date   PLT 116* 06/22/2015    Inpatient Medications  Scheduled Meds: . calcium carbonate  1 tablet Oral Once  . cefUROXime  500 mg Oral BID WC  . feeding supplement (ENSURE ENLIVE)  237 mL Oral BID BM  . folic acid  1 mg Oral Daily  . lactulose  20 g Oral TID  . loratadine  10 mg Oral Daily  . multivitamin with minerals  1 tablet Oral Daily  . prednisoLONE  40 mg Oral Daily  . sodium chloride flush  3 mL Intravenous Q12H  . spironolactone  25 mg Oral Daily  . thiamine  100 mg Oral Daily   Continuous Infusions:   PRN Meds:.LORazepam, oxyCODONE, traZODone  Antibiotics  :    Anti-infectives    Start     Dose/Rate Route Frequency Ordered Stop   06/21/15 1700  cefUROXime (CEFTIN) tablet 500 mg     500 mg Oral 2 times daily with meals 06/21/15 1343     06/20/15 1600  cefTRIAXone (ROCEPHIN) 2 g in dextrose 5 % 50 mL IVPB  Status:  Discontinued     2 g 100 mL/hr over 30 Minutes Intravenous Every 24 hours 06/20/15 1514 06/21/15 1343         Objective:   Filed Vitals:   06/21/15 2122 06/22/15 0423 06/22/15 0801 06/22/15 1006  BP: 156/99 151/97 153/88 145/91  Pulse: 120 126 111 111  Temp: 97.8 F (36.6 C) 98.2 F (36.8 C) 98.5 F (36.9 C) 98.1 F (36.7 C)  TempSrc: Axillary Oral Oral Oral  Resp: Height:      Weight:      SpO2: 100% 98% 99% 100%    Wt Readings from Last 3 Encounters:  06/17/15 69.491 kg (153 lb 3.2 oz)  01/31/15 68.04 kg (150 lb)  12/10/13 73.483 kg (162 lb)     Intake/Output Summary (Last 24  hours) at 06/22/15 1058 Last data filed at 06/22/15 0737  Gross per 24 hour  Intake    600 ml  Output    300 ml  Net    300 ml     Physical Exam  General exam: He is more awake and alert, oriented, significant icterus Respiratory system: Clear to auscultation. Respiratory effort normal. Cardiovascular system: S1 & S2 heard, RRR. No  JVD, murmurs, rubs, gallops or clicks. No pedal edema. Gastrointestinal system: There is some abdominal distention, soft and nontender. No organomegaly or masses felt. Normal bowel sounds heard. Central nervous system: Alert. . No focal neurological deficits. Extremities: Symmetric 5 x 5 power. Skin: No rashes, lesions or ulcers Psychiatry: mildly confused   Data Review:    CBC  Recent Labs Lab 06/17/15 0620 06/18/15 0455 06/19/15 0335 06/20/15 0426 06/22/15 0436  WBC 9.2 9.4 9.0 11.9* 14.6*  HGB 13.2 12.4* 12.0* 11.6* 12.3*  HCT 37.2* 35.5* 33.3* 32.5* 35.0*  PLT 99* 74* 70* 98* 116*  MCV 86.1 85.5 84.7 84.6 86.6  MCH 30.6 29.9 30.5 30.2 30.4  MCHC 35.5 34.9 36.0 35.7 35.1  RDW 19.3* 19.1* 19.4* 19.9* 21.8*    Chemistries   Recent Labs Lab 06/17/15 0016 06/17/15 0104  06/18/15 0455 06/19/15 0335 06/20/15 0426 06/21/15 0456 06/22/15 0436  NA 128*  --   < > 136 139 142 143 139  K 2.7*  --   < > 2.3* 2.2* 2.5* 3.2* 3.8  CL 86*  --   < > 101 108 110 111 107  CO2 23  --   < > 26 26 27 26 26   GLUCOSE 61*  --   < > 150* 159* 122* 110* 89  BUN 9  --   < > 12 7 9 11 16   CREATININE 0.56*  --   < > 0.60* 0.41* 0.30* 0.36* 0.34*  CALCIUM 7.8*  --   < > 7.6* 7.9* 8.0* 7.9* 8.0*  MG  --  1.8  --   --   --   --  1.8  --   AST 177*  --   --  209* 173*  --  119* 114*  ALT 38  --   --  47 48  --  49 56  ALKPHOS 267*  --   --  215* 191*  --  207* 210*  BILITOT 20.4*  --   --  21.7* 22.5*  --  25.5* 29.2*  < > = values in this interval not displayed. ------------------------------------------------------------------------------------------------------------------ No results for input(s): CHOL, HDL, LDLCALC, TRIG, CHOLHDL, LDLDIRECT in the last 72 hours.  No results found for: HGBA1C ------------------------------------------------------------------------------------------------------------------ No results for input(s): TSH, T4TOTAL, T3FREE, THYROIDAB in the last 72  hours.  Invalid input(s): FREET3 ------------------------------------------------------------------------------------------------------------------ No results for input(s): VITAMINB12, FOLATE, FERRITIN, TIBC, IRON, RETICCTPCT in the last 72 hours.  Coagulation profile  Recent Labs Lab 06/17/15 0620 06/18/15 0455 06/21/15 0456  INR 1.72* 1.93* 2.06*    No results for input(s): DDIMER in the last 72 hours.  Cardiac Enzymes No results for input(s): CKMB, TROPONINI, MYOGLOBIN in the last 168 hours.  Invalid input(s): CK ------------------------------------------------------------------------------------------------------------------ No results found for: BNP  Micro Results No results found for this or any previous visit (from the past 240 hour(s)).  Radiology Reports US Abdomen Complete  06/20/2015  CLINICAL DATA:  Cirrhosis, elevated LFTs EXAM: ABDOMEN ULTRASOUND COMPLETE COMPARISON:  06/17/2015 FINDINGS: Gallbladder: Gallbladder is surgically absent Common bile duct: Diameter: 2 mm in diameter within  normal limits. Liver: No focal hepatic mass is noted. Again noted increased echogenicity of the liver suspicious for fatty infiltration or chronic liver disease. Small perihepatic ascites. Again noted the reversal of flow in portal vein consistent with portal hypertension IVC: No abnormality visualized. Pancreas: Visualized portion unremarkable. Spleen: Size and appearance within normal limits. Measures 5.7 cm in length Right Kidney: Length: 11.6 cm. Echogenicity within normal limits. No mass or hydronephrosis visualized. Left Kidney: Length: 11 cm. Echogenicity within normal limits. No mass or hydronephrosis visualized. Abdominal aorta: No aneurysm visualized. Other findings: Abdominal ascites noted bilateral lower quadrant. IMPRESSION: 1. Surgical absent gallbladder.  Normal CBD. 2. Again noted diffuse increased echogenicity of the liver suspicious for fatty infiltration or chronic  parenchymal liver disease. Again noted reversal of flow portal vein consistent with portal hypertension. 3. No focal hepatic mass. 4. No hydronephrosis. 5. Abdominal ascites is noted. Electronically Signed   By: Natasha MeadLiviu  Pop M.D.   On: 06/20/2015 11:42   Dg Abd Acute W/chest  06/17/2015  CLINICAL DATA:  Jaundice. Upper abdominal pain and nausea for 1 week. Alcohol use. EXAM: DG ABDOMEN ACUTE W/ 1V CHEST COMPARISON:  Chest 01/07/2009.  CT abdomen and pelvis 06/03/2003. FINDINGS: Normal heart size and pulmonary vascularity. Blunting of the right costophrenic angle suggests a small effusion. No focal airspace disease or consolidation in the lungs. No pneumothorax. Mediastinal contours appear intact. Scattered gas and stool throughout the colon. No small or large bowel distention. No free intra-abdominal air. No abnormal air-fluid levels. Surgical clips in the right upper quadrant. Calcified phleboliths in the pelvis. No radiopaque stones. Visualized bones appear intact. IMPRESSION: Probable small right pleural effusion. Normal nonobstructive bowel gas pattern. Electronically Signed   By: Burman NievesWilliam  Stevens M.D.   On: 06/17/2015 02:50   [place] Abdomen Limited Ruq  06/17/2015  CLINICAL DATA:  Elevated liver enzymes EXAM: US ABDOMEN LIMITED - RIGHT UPPER QUADRANT COMPARISON:  August 15, 2013 FINDINGS: Gallbladder: Surgically absent. Common bile duct: Diameter: 3 mm. No intrahepatic or extrahepatic biliary duct dilatation. Liver: No focal lesion identified. The liver echogenicity overall is increased. Flow in the portal vein is hepatofugal, opposite of normal anatomic direction. A small amount of ascites is noted around the liver. IMPRESSION: Evidence of portal venous hypertension with reversal of flow in the portal vein. The liver has an appearance consistent with hepatic steatosis and likely underlying parenchymal liver disease. While no focal liver lesions are identified, the sensitivity of ultrasound for focal liver  lesions is diminished significantly in this circumstance. A small amount of ascites surrounding the liver is noted. Gallbladder is absent. No biliary duct dilatation evident. Electronically Signed   By: Bretta BangWilliam  Woodruff III M.D.   On: 06/17/2015 07:24    Time Spent in minutes  35   Nazli Penn A PA-S on 06/22/2015 at 10:58 AM

## 2015-06-23 LAB — COMPREHENSIVE METABOLIC PANEL
ALT: 52 U/L (ref 17–63)
ANION GAP: 6 (ref 5–15)
AST: 133 U/L — AB (ref 15–41)
Albumin: 1.8 g/dL — ABNORMAL LOW (ref 3.5–5.0)
Alkaline Phosphatase: 208 U/L — ABNORMAL HIGH (ref 38–126)
BUN: 18 mg/dL (ref 6–20)
CHLORIDE: 106 mmol/L (ref 101–111)
CO2: 25 mmol/L (ref 22–32)
Calcium: 7.9 mg/dL — ABNORMAL LOW (ref 8.9–10.3)
Creatinine, Ser: <0.3 mg/dL — ABNORMAL LOW (ref 0.61–1.24)
Glucose, Bld: 86 mg/dL (ref 65–99)
POTASSIUM: 3.8 mmol/L (ref 3.5–5.1)
Sodium: 137 mmol/L (ref 135–145)
TOTAL PROTEIN: 5.6 g/dL — AB (ref 6.5–8.1)
Total Bilirubin: 28.4 mg/dL (ref 0.3–1.2)

## 2015-06-23 LAB — GLUCOSE, CAPILLARY
GLUCOSE-CAPILLARY: 93 mg/dL (ref 65–99)
GLUCOSE-CAPILLARY: 93 mg/dL (ref 65–99)
GLUCOSE-CAPILLARY: 79 mg/dL (ref 65–99)
Glucose-Capillary: 124 mg/dL — ABNORMAL HIGH (ref 65–99)
Glucose-Capillary: 110 mg/dL — ABNORMAL HIGH (ref 65–99)

## 2015-06-23 LAB — PROTIME-INR
INR: 1.82 — ABNORMAL HIGH (ref 0.00–1.49)
Prothrombin Time: 20.4 seconds — ABNORMAL HIGH (ref 11.6–15.2)

## 2015-06-23 NOTE — Consult Note (Signed)
Referring Provider:  Dr. Judie PetitM. Elmahi Primary Care Physician:  No PCP Per Patient Primary Gastroenterologist:  None (unassigned)  Reason for Consultation:  Refractory alcoholic hepatitis  HPI: Miguel Washington is a 55 y.o. male admitted to the hospital about 5 days ago with altered mental status, felt to be reflective of alcoholic liver disease with probably an element of hepatic encephalopathy, improved since admission, but with persistent elevation of bilirubin despite now approximately a week of alcohol abstinence, prompting the hospitalist physician to ask us to review the case to make sure that no further intervention was needed.  The patient had a DUI at the beginning of this year and saw a therapist to address it, as well as ordered by the court, but he declined a rehabilitation program.   He has not had medical complications related to his alcohol prior to this admission.  In the hospital, he has had a fairly smooth detoxification process and his mental status has improved, but his bilirubin has hovered in the 20s without evident improvement.  His ultrasound does not show splenomegaly, and just minimal ascites. There is reversal of flow in the portal vein consistent with portal hypertension. It is noted that his platelet count is low, under 120,000 consistently.  The Maddrey score is 60 (above 32 indicates poor prognosis) and his MELD score is 26 (consistent with a 20% three-month mortality risk).  The patient lives at home with his wife. He indicates that his wife does drink alcohol, but apparently not as much as he does. 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 He indicates she's been drinking heavily since his 4120s. He goes through a fifth of vodka a week, by his report, not counting what he drinks when he is at various band gigs.   Past Medical History  Diagnosis Date  . Hypertension   . Anxiety     Past Surgical History   Procedure Laterality Date  . Cholecystectomy      Prior to Admission medications   Medication Sig Start Date End Date Taking? Authorizing Provider  amLODipine (NORVASC) 2.5 MG tablet Take 1 tablet (2.5 mg total) by mouth daily. 01/31/15  Yes Wallis BambergMario Mani, PA-C  cetirizine (ZYRTEC) 10 MG tablet Take 1 tablet (10 mg total) by mouth daily. 01/31/15  Yes Wallis BambergMario Mani, PA-C  metoprolol (LOPRESSOR) 100 MG tablet Take 1 tablet (100 mg total) by mouth 2 (two) times daily. 01/31/15  Yes Wallis BambergMario Mani, PA-C  azithromycin (ZITHROMAX) 250 MG tablet Start with 2 tablets today, then 1 daily thereafter. Patient not taking: Reported on 06/17/2015 01/31/15   Wallis BambergMario Mani, PA-C  benzonatate (TESSALON) 100 MG capsule Take 1-2 capsules (100-200 mg total) by mouth 3 (three) times daily as needed for cough. Patient not taking: Reported on 06/17/2015 01/31/15   Wallis BambergMario Mani, PA-C    Current Facility-Administered Medications  Medication Dose Route Frequency Provider Last Rate Last Dose  . calcium carbonate (TUMS - dosed in mg elemental calcium) chewable tablet 200 mg of elemental calcium  1 tablet Oral Once April Palumbo, MD   200 mg of elemental calcium at 06/17/15 0216  . cefUROXime (CEFTIN) tablet 500 mg  500 mg Oral BID WC Jeralyn BennettEzequiel Zamora, MD   500 mg at 06/23/15 [id number]  . feeding supplement (ENSURE ENLIVE) (ENSURE ENLIVE) liquid 237 mL  237 mL Oral BID BM Belkys A Regalado, MD   237 mL at 06/23/15 1557  . folic acid (FOLVITE) tablet 1  mg  1 mg Oral Daily Haydee Salter, MD   1 mg at 06/23/15 1100  . furosemide (LASIX) tablet 20 mg  20 mg Oral Daily Clydia Llano, MD   20 mg at 06/23/15 1100  . lactulose (CHRONULAC) 10 GM/15ML solution 20 g  20 g Oral TID Jeralyn Bennett, MD   20 g at 06/23/15 1557  . loratadine (CLARITIN) tablet 10 mg  10 mg Oral Daily Haydee Salter, MD   10 mg at 06/23/15 1100  . multivitamin with minerals tablet 1 tablet  1 tablet Oral Daily Haydee Salter, MD   1 tablet at 06/23/15 1100  . oxyCODONE (Oxy  IR/ROXICODONE) immediate release tablet 5 mg  5 mg Oral Q4H PRN Haydee Salter, MD      . prednisoLONE tablet 40 mg  40 mg Oral Daily Jeralyn Bennett, MD   40 mg at 06/23/15 1100  . sodium chloride flush (NS) 0.9 % injection 3 mL  3 mL Intravenous Q12H Haydee Salter, MD   3 mL at 06/23/15 1000  . spironolactone (ALDACTONE) tablet 50 mg  50 mg Oral Daily Clydia Llano, MD   50 mg at 06/23/15 1100  . thiamine (VITAMIN B-1) tablet 100 mg  100 mg Oral Daily Haydee Salter, MD   100 mg at 06/23/15 1100  . traZODone (DESYREL) tablet 25 mg  25 mg Oral QHS PRN Haydee Salter, MD        Allergies as of 06/16/2015  . (No Known Allergies)    Family History  Problem Relation Age of Onset  . Cancer Mother     Breast cancer; brain metastasis  . Diabetes Mother   . Heart disease Father 45    AMI x 2.  . Hypertension Father   . Hypertension Sister   . Hypertension Sister   . Diabetes Sister     Social History   Social History  . Marital Status: Divorced    Spouse Name: N/A  . Number of Children: N/A  . Years of Education: N/A   Occupational History  . Not on file.   Social History Main Topics  . Smoking status: Never Smoker   . Smokeless tobacco: Not on file  . Alcohol Use: 12.6 oz/week    21 Shots of liquor per week  . Drug Use: No  . Sexual Activity: Not on file   Other Topics Concern  . Not on file   Social History Narrative   Marital status: divorced and now dating ex wife x 10 years.      Children: none      Lives: with ex-wife.      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  No DWIs.  Drinking daily x 20 years.      Drugs:  None      Exercise: none          Review of Systems:  See HPI  Physical Exam: Vital signs in last 24 hours: Temp:  [98 F (36.7 C)-98.3 F (36.8 C)] 98.3 F (36.8 C) (06/19 1337) Pulse Rate:  [101-121] 102 (06/19 1337) Resp:  [18-20] 18 (06/19  1337) BP: (145-166)/(84-104) 145/84 mmHg (06/19 1337) SpO2:  [100 %] 100 % (06/19 1337) Last BM Date: 06/22/15 General: This patient appears reasonably well-nourished, not cachectic, deeply jaundiced with scleral icterus and jaundice of his palms,  somewhat tremulous, but overall fairly normal mental status. Head:  Normocephalic and atraumatic. Eyes:  Sclera severely icteric.    Neck:   No masses or thyromegaly. Lungs:  Clear throughout to auscultation.   No wheezes, crackles, or rhonchi. No evident respiratory distress. Heart:   Regular rate and rhythm; no murmurs, clicks, rubs,  or gallops. Abdomen: Mildly distended suggestive of ascites, although I think there is some flank tympany bilaterally. Soft, nontender. No masses, hepatosplenomegaly or ventral hernias noted. Msk:   Symmetrical without gross deformities. Extremities:   1+ tibial edema Neurologic:  Alert and coherent;  slightly tremulous, oriented to date (actually, was off by 1 day), does serial twos fairly slowly and with one error. Skin:  Intact without significant lesions or rashes. Cervical Nodes:  No significant cervical adenopathy. Psych:   Alert and cooperative. Normal mood and affect.  Intake/Output from previous day: 06/18 0701 - 06/19 0700 In: 360 [P.O.:360] Out: -  Intake/Output this shift: Total I/O In: 300 [P.O.:300] Out: -   Lab Results:  Recent Labs  06/22/15 0436  WBC 14.6*  HGB 12.3*  HCT 35.0*  PLT 116*   BMET  Recent Labs  06/21/15 0456 06/22/15 0436 06/23/15 0449  NA 143 139 137  K 3.2* 3.8 3.8  CL 111 107 106  CO2 GLUCOSE 110* 89 86  BUN CREATININE 0.36* 0.34* <0.30*  CALCIUM 7.9* 8.0* 7.9*   LFT  Recent Labs  06/23/15 0449  PROT 5.6*  ALBUMIN 1.8*  AST 133*  ALT 52  ALKPHOS 208*  BILITOT 28.4*   PT/INR  Recent Labs  06/21/15 0456 06/23/15 1225  LABPROT 22.4* 20.4*  INR 2.06* 1.82*    Studies/Results: No results found.  Impression: 1. Labs  and history most consistent with alcoholic hepatitis, with reversal of portal venous flow and thrombocytopenia consistent with early cirrhosis versus transient hepatocellular edema from alcohol.   2.Absence of improvement with alcohol abstinence this early on is not highly worrisome.  3. Severity of liver disease is sufficiently bad to warrant discussion regarding steroid therapy, which unfortunately is only marginally effective compared to doing nothing.  Plan: 1. Today's 20 minute visit was spent primarily educating the patient regarding alcoholic hepatitis, risk factors, natural history, prognosis, and importance regarding alcohol avoidance. 2. We will discuss the possibility of steroids when I see him tomorrow.   LOS: 6 days   Elverda Wendel V  06/23/2015, 5:01 PM   Pager [phone] If no answer or after 5 PM call [phone]

## 2015-06-23 NOTE — Plan of Care (Signed)
Problem: Safety: Goal: Ability to remain free from injury will improve Outcome: Adequate for Discharge Pt educated on safety precautions, pt aware to call for assistance.

## 2015-06-23 NOTE — Progress Notes (Signed)
PROGRESS NOTE                                                                                                                                                                                                             Patient Demographics:    Miguel Washington, is a 55 y.o. male, DOB - 12/16/1960, NWG:[id number] Admit date - 06/17/2015   Admitting Physician Haydee Salter, MD Outpatient Primary MD for the patient is No PCP Per Patient  LOS - 6  Chief Complaint  Patient presents with  . Fatigue      Brief Narrative  Miguel Washington is a 55 y.o. male patient came to the emergency room with a complaint of weakness. He has been drinking daily a large amount of alcohol. He states that his last drink was the morning of 6/13. Patient drinks more than he eats, He drinks about quarter of the fifth of vodka per day. Admitted with alcoholic hepatitis, encephalopathy, alcohol intoxication. Began lactulose and Steroids   Subjective:   Reported he feels okay, still mildly disoriented and slow. Reported 4 bowel movements yesterday.   Assessment  & Plan :    Principal Problem:   Alcoholic liver disease (HCC) Active Problems:   HYPERTENSION, BENIGN ESSENTIAL   Hyponatremia   Acute alcohol intoxication (HCC)   Hypokalemia   Abnormal LFTs   Hyperbilirubinemia   Prolonged Q-T interval on ECG   Hyperammonemia (HCC)   Malnutrition of moderate degree   Alcoholic hepatitis;  -On 06/18/2015 lab work showing a total bilirubin of 21.7, AST of 209, ALT of 47 INR was 1.9 and PTT of 21.3. -He was admitted on 06/17/2015 presented after trying to detox from alcohol for the last 3 days although admitted to drinking alcohol the day of his admission. -Maddrey's hepatic discriminant function at 63.7; greater than 32 indicating severe disease, thus this patient would likely benefit from glucocorticosteroid treatment -Case was discussed  with Dr. Randa Evens of GI who agreed, recommended Solumedrol 40 mg IV daily -Transaminases gradually trending down -06/21/2015 he was able tolerate by mouth for which he was switched to prednisolone 40 mg PO. -Abdominal ultrasound revealed presence of ascites. Started spironolactone 50 mg by mouth daily and lasix 20 mg PO q daily. -Patient appears to be not progressing as expected, GI consulted for their input.  Alcohol intoxication:  -Patient  having history of chronic alcohol abuse, apparently had been try to detox for the past 3 days however presented with alcohol level of 79. -This was complicated by alcohol induced hepatitis -He does not have evidence of delirium tremens  Acute encephalopathy;  -Suspect secondary to hepatic encephalopathy with labs showing an elevated ammonia level of 124. -During my evaluation he was lethargic, difficult to arouse -Initially he was not able to tolerate oral intake, given lactulose enemas. -On 06/21/2015 more awake and alert, was able to tolerate oral diet placed on oral lactulose.  ?Spontaneous bacterial peritonitis  -Lab work showed elevated white count of 11,900, (No paracentesis done). -Although he did not have tender abdomen he did present with significant mental status changes. -Was on ceftriaxone, on Ceftin now. Been on antibiotic for 6 days, likely is continue antibiotics in the morning.  Hypokalemia -A.m. lab work showing potassium of 2.2 on  06/19/15 -This is repleted with oral and IV supplements, back to normal, spironolactone started.  Hypertension -Starting diuretic therapy today  Hyperammonemia -Now tolerating by mouth intake, stop lactulose enemas and place him on oral lactulose TID   Code Status :   Full Code  Family Communication  :   No family at bedside  Disposition Plan  :   Getting better anticipate discharge in the next 48-72 hours  Consults  :   Speech therapy  Procedures  :   None  DVT Prophylaxis  :    SCDs     Lab Results  Component Value Date   PLT 116* 06/22/2015    Inpatient Medications  Scheduled Meds: . calcium carbonate  1 tablet Oral Once  . cefUROXime  500 mg Oral BID WC  . feeding supplement (ENSURE ENLIVE)  237 mL Oral BID BM  . folic acid  1 mg Oral Daily  . furosemide  20 mg Oral Daily  . lactulose  20 g Oral TID  . loratadine  10 mg Oral Daily  . multivitamin with minerals  1 tablet Oral Daily  . prednisoLONE  40 mg Oral Daily  . sodium chloride flush  3 mL Intravenous Q12H  . spironolactone  50 mg Oral Daily  . thiamine  100 mg Oral Daily   Continuous Infusions:   PRN Meds:.oxyCODONE, traZODone  Antibiotics  :    Anti-infectives    Start     Dose/Rate Route Frequency Ordered Stop   06/21/15 1700  cefUROXime (CEFTIN) tablet 500 mg     500 mg Oral 2 times daily with meals 06/21/15 1343     06/20/15 1600  cefTRIAXone (ROCEPHIN) 2 g in dextrose 5 % 50 mL IVPB  Status:  Discontinued     2 g 100 mL/hr over 30 Minutes Intravenous Every 24 hours 06/20/15 1514 06/21/15 1343         Objective:   Filed Vitals:   06/22/15 1006 06/22/15 1300 06/22/15 2232 06/23/15 0501  BP: 145/91 148/87 166/104 152/93  Pulse: 111 113 121 101  Temp: 98.1 F (36.7 C) 98.3 F (36.8 C) 98.1 F (36.7 C) 98 F (36.7 C)  TempSrc: Oral Oral Oral Oral  Resp: Height:      Weight:      SpO2: 100% 100% 100% 100%    Wt Readings from Last 3 Encounters:  06/17/15 69.491 kg (153 lb 3.2 oz)  01/31/15 68.04 kg (150 lb)  12/10/13 73.483 kg (162 lb)     Intake/Output Summary (Last 24 hours) at 06/23/15 1122  Last data filed at 06/23/15 1043  Gross per 24 hour  Intake    540 ml  Output      0 ml  Net    540 ml     Physical Exam  General exam: He is more awake and alert, oriented, significant icterus Respiratory system: Clear to auscultation. Respiratory effort normal. Cardiovascular system: S1 & S2 heard, RRR. No JVD, murmurs, rubs, gallops or clicks. No pedal  edema. Gastrointestinal system: There is some abdominal distention, soft and nontender. No organomegaly or masses felt. Normal bowel sounds heard. Central nervous system: Alert. . No focal neurological deficits. Extremities: Symmetric 5 x 5 power. Skin: No rashes, lesions or ulcers Psychiatry: mildly confused   Data Review:    CBC  Recent Labs Lab 06/17/15 0620 06/18/15 0455 06/19/15 0335 06/20/15 0426 06/22/15 0436  WBC 9.2 9.4 9.0 11.9* 14.6*  HGB 13.2 12.4* 12.0* 11.6* 12.3*  HCT 37.2* 35.5* 33.3* 32.5* 35.0*  PLT 99* 74* 70* 98* 116*  MCV 86.1 85.5 84.7 84.6 86.6  MCH 30.6 29.9 30.5 30.2 30.4  MCHC 35.5 34.9 36.0 35.7 35.1  RDW 19.3* 19.1* 19.4* 19.9* 21.8*    Chemistries   Recent Labs Lab 06/17/15 0104  06/18/15 0455 06/19/15 0335 06/20/15 0426 06/21/15 0456 06/22/15 0436 06/23/15 0449  NA  --   < > 136 139 142 143 139 137  K  --   < > 2.3* 2.2* 2.5* 3.2* 3.8 3.8  CL  --   < > 101 108 110 111 107 106  CO2  --   < > 26 26 27 26 26 25   GLUCOSE  --   < > 150* 159* 122* 110* 89 86  BUN  --   < > 12 7 9 11 16 18   CREATININE  --   < > 0.60* 0.41* 0.30* 0.36* 0.34* <0.30*  CALCIUM  --   < > 7.6* 7.9* 8.0* 7.9* 8.0* 7.9*  MG 1.8  --   --   --   --  1.8  --   --   AST  --   --  209* 173*  --  119* 114* 133*  ALT  --   --  47 48  --  49 56 52  ALKPHOS  --   --  215* 191*  --  207* 210* 208*  BILITOT  --   --  21.7* 22.5*  --  25.5* 29.2* 28.4*  < > = values in this interval not displayed. ------------------------------------------------------------------------------------------------------------------ No results for input(s): CHOL, HDL, LDLCALC, TRIG, CHOLHDL, LDLDIRECT in the last 72 hours.  No results found for: HGBA1C ------------------------------------------------------------------------------------------------------------------ No results for input(s): TSH, T4TOTAL, T3FREE, THYROIDAB in the last 72 hours.  Invalid input(s):  FREET3 ------------------------------------------------------------------------------------------------------------------ No results for input(s): VITAMINB12, FOLATE, FERRITIN, TIBC, IRON, RETICCTPCT in the last 72 hours.  Coagulation profile  Recent Labs Lab 06/17/15 0620 06/18/15 0455 06/21/15 0456  INR 1.72* 1.93* 2.06*    No results for input(s): DDIMER in the last 72 hours.  Cardiac Enzymes No results for input(s): CKMB, TROPONINI, MYOGLOBIN in the last 168 hours.  Invalid input(s): CK ------------------------------------------------------------------------------------------------------------------ No results found for: BNP  Micro Results No results found for this or any previous visit (from the past 240 hour(s)).  Radiology Reports [place] Abdomen Complete  06/20/2015  CLINICAL DATA:  Cirrhosis, elevated LFTs EXAM: ABDOMEN ULTRASOUND COMPLETE COMPARISON:  06/17/2015 FINDINGS: Gallbladder: Gallbladder is surgically absent Common bile duct: Diameter: 2 mm in diameter within normal limits.  Liver: No focal hepatic mass is noted. Again noted increased echogenicity of the liver suspicious for fatty infiltration or chronic liver disease. Small perihepatic ascites. Again noted the reversal of flow in portal vein consistent with portal hypertension IVC: No abnormality visualized. Pancreas: Visualized portion unremarkable. Spleen: Size and appearance within normal limits. Measures 5.7 cm in length Right Kidney: Length: 11.6 cm. Echogenicity within normal limits. No mass or hydronephrosis visualized. Left Kidney: Length: 11 cm. Echogenicity within normal limits. No mass or hydronephrosis visualized. Abdominal aorta: No aneurysm visualized. Other findings: Abdominal ascites noted bilateral lower quadrant. IMPRESSION: 1. Surgical absent gallbladder.  Normal CBD. 2. Again noted diffuse increased echogenicity of the liver suspicious for fatty infiltration or chronic parenchymal liver disease. Again  noted reversal of flow portal vein consistent with portal hypertension. 3. No focal hepatic mass. 4. No hydronephrosis. 5. Abdominal ascites is noted. Electronically Signed   By: Natasha Mead M.D.   On: 06/20/2015 11:42   Dg Abd Acute W/chest  06/17/2015  CLINICAL DATA:  Jaundice. Upper abdominal pain and nausea for 1 week. Alcohol use. EXAM: DG ABDOMEN ACUTE W/ 1V CHEST COMPARISON:  Chest 01/07/2009.  CT abdomen and pelvis 06/03/2003. FINDINGS: Normal heart size and pulmonary vascularity. Blunting of the right costophrenic angle suggests a small effusion. No focal airspace disease or consolidation in the lungs. No pneumothorax. Mediastinal contours appear intact. Scattered gas and stool throughout the colon. No small or large bowel distention. No free intra-abdominal air. No abnormal air-fluid levels. Surgical clips in the right upper quadrant. Calcified phleboliths in the pelvis. No radiopaque stones. Visualized bones appear intact. IMPRESSION: Probable small right pleural effusion. Normal nonobstructive bowel gas pattern. Electronically Signed   By: Burman Nieves M.D.   On: 06/17/2015 02:50   US Abdomen Limited Ruq  06/17/2015  CLINICAL DATA:  Elevated liver enzymes EXAM: US ABDOMEN LIMITED - RIGHT UPPER QUADRANT COMPARISON:  August 15, 2013 FINDINGS: Gallbladder: Surgically absent. Common bile duct: Diameter: 3 mm. No intrahepatic or extrahepatic biliary duct dilatation. Liver: No focal lesion identified. The liver echogenicity overall is increased. Flow in the portal vein is hepatofugal, opposite of normal anatomic direction. A small amount of ascites is noted around the liver. IMPRESSION: Evidence of portal venous hypertension with reversal of flow in the portal vein. The liver has an appearance consistent with hepatic steatosis and likely underlying parenchymal liver disease. While no focal liver lesions are identified, the sensitivity of ultrasound for focal liver lesions is diminished significantly  in this circumstance. A small amount of ascites surrounding the liver is noted. Gallbladder is absent. No biliary duct dilatation evident. Electronically Signed   By: Bretta Bang III M.D.   On: 06/17/2015 07:24    Time Spent in minutes  35   Decarlos Empey A PA-S on 06/23/2015 at 11:22 AM

## 2015-06-23 NOTE — Progress Notes (Signed)
Speech Language Pathology Treatment: Dysphagia  Patient Details Name: Miguel Washington MRN: [id number] DOB: 10-21-1960 Today's Date: 06/23/2015 Time: 1497-0263 SLP Time Calculation (min) (ACUTE ONLY): 11 min  Assessment / Plan / Recommendation Clinical Impression  Pt demonstrates normal swallow function and has been drinking well per family with no oral holding. Pt demonstrated adequate mastication of large regular bolus today with no oral dysphagia observed. Will upgrade die to allow for more food choices; pt will need set up assist to position bed and cut foods as needed. Posted sign and discussed with family. No SLP f/u needed, will sign off.    HPI HPI: 55 yo male adm to [facility] with AMS - alcoholic liver disease.  PMH + for ETOH use, HTN, hypokalemia.  Pt found to have probable right pleural effusion.  Referral ordered due to pt holding pills and not swallowing.  RN reports pt received medicine last pm but required siginficant coaxing.        SLP Plan  All goals met;Discharge SLP treatment due to (comment)     Recommendations  Diet recommendations: Regular;Thin liquid Liquids provided via: Cup;Straw Medication Administration: Whole meds with puree Supervision: Staff to assist with self feeding Compensations: Slow rate Postural Changes and/or Swallow Maneuvers: Seated upright 90 degrees             Oral Care Recommendations: Oral care BID Plan: All goals met;Discharge SLP treatment due to (comment)     GO               Herbie Baltimore, MA CCC-SLP [phone]  Lynann Beaver 06/23/2015, 2:12 PM

## 2015-06-24 LAB — GLUCOSE, CAPILLARY
GLUCOSE-CAPILLARY: 84 mg/dL (ref 65–99)
GLUCOSE-CAPILLARY: 75 mg/dL (ref 65–99)
Glucose-Capillary: 99 mg/dL (ref 65–99)
Glucose-Capillary: 84 mg/dL (ref 65–99)
Glucose-Capillary: 100 mg/dL — ABNORMAL HIGH (ref 65–99)

## 2015-06-24 LAB — COMPREHENSIVE METABOLIC PANEL
ALT: 64 U/L — AB (ref 17–63)
AST: 135 U/L — AB (ref 15–41)
Albumin: 1.9 g/dL — ABNORMAL LOW (ref 3.5–5.0)
Alkaline Phosphatase: 252 U/L — ABNORMAL HIGH (ref 38–126)
Anion gap: 8 (ref 5–15)
BUN: 22 mg/dL — AB (ref 6–20)
CHLORIDE: 102 mmol/L (ref 101–111)
CO2: 24 mmol/L (ref 22–32)
CREATININE: 0.61 mg/dL (ref 0.61–1.24)
Calcium: 8.2 mg/dL — ABNORMAL LOW (ref 8.9–10.3)
GFR calc Af Amer: >60 mL/min (ref >60)
Glucose, Bld: 80 mg/dL (ref 65–99)
Potassium: 2.9 mmol/L — ABNORMAL LOW (ref 3.5–5.1)
Sodium: 134 mmol/L — ABNORMAL LOW (ref 135–145)
Total Bilirubin: 32.1 mg/dL (ref 0.3–1.2)
Total Protein: 6.7 g/dL (ref 6.5–8.1)

## 2015-06-24 LAB — PROTIME-INR
INR: 1.79 — ABNORMAL HIGH (ref 0.00–1.49)
PROTHROMBIN TIME: 20.2 s — AB (ref 11.6–15.2)

## 2015-06-24 MED ORDER — POTASSIUM CHLORIDE CRYS ER 20 MEQ PO TBCR
60.0000 meq | EXTENDED_RELEASE_TABLET | Freq: Four times a day (QID) | ORAL | Status: AC
  Administered 2015-06-24 (×2): 60 meq via ORAL
  Filled 2015-06-24 (×2): qty 3

## 2015-06-24 NOTE — Progress Notes (Signed)
Patient looks about the same as yesterday, still alert and coherent, but slightly tremulous.  Bilirubin has risen a slight amount, although pro time is normal.  Given his elevated Maddrey score, the fact that he seemed to present with some degree of encephalopathy initially on this admission, and given the progressive rise in his bilirubin despite alcohol avoidance for approximately a week now, I would favor institution of steroids as treatment for alcoholic hepatitis. The uncertain benefit of this intervention, along with potential risks including avascular necrosis of the hip, mood or mental status changes, and increased susceptibility to infection, were all discussed with the patient. The patient is agreeable.  I will start prednisolone 40 mg each morning, with an anticipated 6 week course of therapy, beginning at taper at 4 weeks.  Florencia Reasonsobert V. Darryn Kydd, M.D. Pager [phone] If no answer or after 5 PM call [phone]

## 2015-06-24 NOTE — Progress Notes (Signed)
ADDENDUM to previous progress note from today:  I see where pt is ALREADY ON PREDISOLONE; I was unaware of that when I discussed this with hospitalist yesterday and with patient this morning.  In any event, both patient and I concur with its usage.  Florencia Reasonsobert V. Atiyah Bauer, M.D. Pager [phone] If no answer or after 5 PM call [phone]

## 2015-06-24 NOTE — Progress Notes (Signed)
Nutrition Follow-up  DOCUMENTATION CODES:   Non-severe (moderate) malnutrition in context of chronic illness  INTERVENTION:   Continue Ensure Enlive po BID, each supplement provides 350 kcal and 20 grams of protein Encourage PO intake RD to continue to monitor  NUTRITION DIAGNOSIS:   Increased nutrient needs related to other (see comment) (ETOH abuse) as evidenced by estimated needs.  Ongoing.  GOAL:   Patient will meet greater than or equal to 90% of their needs  Not meeting.  MONITOR:   PO intake, Supplement acceptance, Labs, Weight trends, I & O's  ASSESSMENT:   55 y.o. male patient came to the emergency room with a complaint of weakness. He has been drinking daily a large amount of alcohol. He states that his last drink was the morning of admission. Patient drinks more than he eats.   Patient reports continued poor appetite. PO intake: 25%. He does state that he is drinking the Ensure supplements, one was sitting at the bedside unopened. RD offered to order lunch for patient but pt declined. He said he would try to eat a little lunch today and drink both of his supplements. RD to monitor for improvements in PO intake and appetite. Pt much more alert than when initially assessed.  Medications: Folic acid tablet daily, Lasix tablet daily, Multivitamin with minerals daily, Thiamine tablet daily Labs reviewed: Low Na, K  Diet Order:  Diet regular Room service appropriate?: Yes; Fluid consistency:: Thin  Skin:  Reviewed, no issues  Last BM:  6/19  Height:   Ht Readings from Last 1 Encounters:  06/17/15 5\' 6"  (1.676 m)    Weight:   Wt Readings from Last 1 Encounters:  06/17/15 153 lb 3.2 oz (69.491 kg)    Ideal Body Weight:  64.5 kg  BMI:  Body mass index is 24.74 kg/(m^2).  Estimated Nutritional Needs:   Kcal:  2100-2300  Protein:  105-115g  Fluid:  2.3L/day  EDUCATION NEEDS:   No education needs identified at this time  Tilda FrancoLindsey Willamae Demby, MS, RD,  LDN Pager: [phone] After Hours Pager: [phone]

## 2015-06-24 NOTE — Progress Notes (Signed)
PROGRESS NOTE                                                                                                                                                                                                             Patient Demographics:    Miguel Washington, is a 55 y.o. male, DOB - 1960/11/06, ZOX:[id number]  Admit date - 06/17/2015   Admitting Physician Haydee Salter, MD  Outpatient Primary MD for the patient is No PCP Per Patient  LOS - 7  Chief Complaint  Patient presents with  . Fatigue       Brief Narrative  Miguel Washington is a 55 y.o. male patient came to the emergency room with a complaint of weakness. He has been drinking daily a large amount of alcohol. He states that his last drink was the morning of 6/13. Patient drinks more than he eats, He drinks about quarter of the fifth of vodka per day. Admitted with alcoholic hepatitis, encephalopathy, alcohol intoxication. Began lactulose and Steroids    Subjective:   States he is fine, abdominal distention is improving as well. Confusion has improved   Assessment  & Plan :    Principal Problem:   Alcoholic liver disease (HCC) Active Problems:   HYPERTENSION, BENIGN ESSENTIAL   Hyponatremia   Acute alcohol intoxication (HCC)   Hypokalemia   Abnormal LFTs   Hyperbilirubinemia   Prolonged Q-T interval on ECG   Hyperammonemia (HCC)   Malnutrition of moderate degree   Alcoholic hepatitis;  -On 06/18/2015 lab work showing a total bilirubin of 21.7, AST of 209, ALT of 47 INR was 1.9 and PTT of 21.3. -He was admitted on 06/17/2015 presented after trying to detox from alcohol for the last 3 days although admitted to drinking alcohol the day of his admission. -Maddrey's hepatic discriminant function at 63.7; greater than 32 indicating severe disease. -Patient was on IV Solu-Medrol 40 mg for 3 days, started on the 14th, switched to prednisolone on  the 06/21/15 -Transaminases gradually trending down -06/21/2015 he was able tolerate by mouth for which he was switched to prednisolone 40 mg PO. -Abdominal ultrasound revealed presence of ascites. Started spironolactone 50 mg by mouth daily and lasix 20 mg PO q daily on 6/19. -Patient reports abdominal distension is improving today -GI concurs with prednisolone 40 mg PO, recommending 4 weeks of  treatment plus 2 weeks of taper after.   Alcohol intoxication:  -Patient having history of chronic alcohol abuse, apparently had been try to detox for the past 3 days however presented with alcohol level of 79. -This was complicated by alcohol induced hepatitis -He does not have evidence of delirium tremens  Acute encephalopathy;  -Suspect secondary to hepatic encephalopathy with labs showing an elevated ammonia level of 124. -During my evaluation he was lethargic, difficult to arouse -Initially he was not able to tolerate oral intake, given lactulose enemas. -On 06/21/2015 more awake and alert, was able to tolerate oral diet placed on oral lactulose. -Confusion has resolved  ?Spontaneous bacterial peritonitis  -Lab work showed elevated white count of 11,900, (No paracentesis done). -Although he did not have tender abdomen he did present with significant mental status changes. -Was on ceftriaxone, then on Ceftin for combined total of 7 days.  -Discontinue antibiotics.  Hypokalemia -A.m. lab work showing potassium of 2.2 on 06/19/15 -This is repleted with oral and IV supplements, back to normal on 6/18, spironolactone started. -2.9 today, will replete orally -Check CMP in the morning  Hypertension -Continue diuretic therapy.  Hyperammonemia -Now tolerating by mouth intake, stop lactulose enemas and place him on oral lactulose TID    Code Status :   Full Code  Family Communication  :   Brother at bedside  Disposition Plan  :   Much better today Discharge tomorrow  Consults  :     Speech therapy  Procedures  :   None  DVT Prophylaxis  :  SCDs  Lab Results  Component Value Date   PLT 116* 06/22/2015    Inpatient Medications  Scheduled Meds: . calcium carbonate  1 tablet Oral Once  . feeding supplement (ENSURE ENLIVE)  237 mL Oral BID BM  . folic acid  1 mg Oral Daily  . furosemide  20 mg Oral Daily  . lactulose  20 g Oral TID  . loratadine  10 mg Oral Daily  . multivitamin with minerals  1 tablet Oral Daily  . potassium chloride  60 mEq Oral Q6H  . prednisoLONE  40 mg Oral Daily  . sodium chloride flush  3 mL Intravenous Q12H  . spironolactone  50 mg Oral Daily  . thiamine  100 mg Oral Daily   Continuous Infusions:  PRN Meds:.oxyCODONE, traZODone  Antibiotics  :    Anti-infectives    Start     Dose/Rate Route Frequency Ordered Stop   06/21/15 1700  cefUROXime (CEFTIN) tablet 500 mg  Status:  Discontinued     500 mg Oral 2 times daily with meals 06/21/15 1343 06/24/15 0704   06/20/15 1600  cefTRIAXone (ROCEPHIN) 2 g in dextrose 5 % 50 mL IVPB  Status:  Discontinued     2 g 100 mL/hr over 30 Minutes Intravenous Every 24 hours 06/20/15 1514 06/21/15 1343         Objective:   Filed Vitals:   06/23/15 0501 06/23/15 1337 06/23/15 2300 06/24/15 0500  BP: 152/93 145/84 145/81 140/89  Pulse: 101 102 95 102  Temp: 98 F (36.7 C) 98.3 F (36.8 C) 98.6 F (37 C) 98.8 F (37.1 C)  TempSrc: Oral Oral Oral Oral  Resp: 18 18 20 18   Height:      Weight:      SpO2: 100% 100% 100% 100%    Wt Readings from Last 3 Encounters:  06/17/15 69.491 kg (153 lb 3.2 oz)  01/31/15 68.04 kg (150 lb)  12/10/13 73.483 kg (162 lb)     Intake/Output Summary (Last 24 hours) at 06/24/15 1047 Last data filed at 06/24/15 0908  Gross per 24 hour  Intake      3 ml  Output      0 ml  Net      3 ml     Physical Exam  General exam: He is more awake and alert, oriented, significant icterus Respiratory system: Clear to auscultation. Respiratory effort  normal. Cardiovascular system: S1 & S2 heard, RRR. No JVD, murmurs, rubs, gallops or clicks. + Leg edema. Gastrointestinal system: There is some abdominal distention, soft and nontender. No organomegaly or masses felt. Normal bowel sounds heard. Central nervous system: Alert. . No focal neurological deficits. Extremities: Symmetric 5 x 5 power. Skin: No rashes, lesions or ulcers Psychiatry: mildly confused    Data Review:    CBC  Recent Labs Lab 06/18/15 0455 06/19/15 0335 06/20/15 0426 06/22/15 0436  WBC 9.4 9.0 11.9* 14.6*  HGB 12.4* 12.0* 11.6* 12.3*  HCT 35.5* 33.3* 32.5* 35.0*  PLT 74* 70* 98* 116*  MCV 85.5 84.7 84.6 86.6  MCH 29.9 30.5 30.2 30.4  MCHC 34.9 36.0 35.7 35.1  RDW 19.1* 19.4* 19.9* 21.8*    Chemistries   Recent Labs Lab 06/19/15 0335 06/20/15 0426 06/21/15 0456 06/22/15 0436 06/23/15 0449 06/24/15 0518  NA 139 142 143 139 137 134*  K 2.2* 2.5* 3.2* 3.8 3.8 2.9*  CL 108 110 111 107 106 102  CO2 26 27 26 26 25 24   GLUCOSE 159* 122* 110* 89 86 80  BUN 7 9 11 16 18  22*  CREATININE 0.41* 0.30* 0.36* 0.34* <0.30* 0.61  CALCIUM 7.9* 8.0* 7.9* 8.0* 7.9* 8.2*  MG  --   --  1.8  --   --   --   AST 173*  --  119* 114* 133* 135*  ALT 48  --  49 56 52 64*  ALKPHOS 191*  --  207* 210* 208* 252*  BILITOT 22.5*  --  25.5* 29.2* 28.4* 32.1*   ------------------------------------------------------------------------------------------------------------------ No results for input(s): CHOL, HDL, LDLCALC, TRIG, CHOLHDL, LDLDIRECT in the last 72 hours.  No results found for: HGBA1C ------------------------------------------------------------------------------------------------------------------ No results for input(s): TSH, T4TOTAL, T3FREE, THYROIDAB in the last 72 hours.  Invalid input(s): FREET3 ------------------------------------------------------------------------------------------------------------------ No results for input(s): VITAMINB12, FOLATE,  FERRITIN, TIBC, IRON, RETICCTPCT in the last 72 hours.  Coagulation profile  Recent Labs Lab 06/18/15 0455 06/21/15 0456 06/23/15 1225 06/24/15 0518  INR 1.93* 2.06* 1.82* 1.79*    No results for input(s): DDIMER in the last 72 hours.  Cardiac Enzymes No results for input(s): CKMB, TROPONINI, MYOGLOBIN in the last 168 hours.  Invalid input(s): CK ------------------------------------------------------------------------------------------------------------------ No results found for: BNP  Micro Results No results found for this or any previous visit (from the past 240 hour(s)).  Radiology Reports [place] Abdomen Complete  06/20/2015  CLINICAL DATA:  Cirrhosis, elevated LFTs EXAM: ABDOMEN ULTRASOUND COMPLETE COMPARISON:  06/17/2015 FINDINGS: Gallbladder: Gallbladder is surgically absent Common bile duct: Diameter: 2 mm in diameter within normal limits. Liver: No focal hepatic mass is noted. Again noted increased echogenicity of the liver suspicious for fatty infiltration or chronic liver disease. Small perihepatic ascites. Again noted the reversal of flow in portal vein consistent with portal hypertension IVC: No abnormality visualized. Pancreas: Visualized portion unremarkable. Spleen: Size and appearance within normal limits. Measures 5.7 cm in length Right Kidney: Length: 11.6 cm. Echogenicity within normal limits. No mass or hydronephrosis visualized.  Left Kidney: Length: 11 cm. Echogenicity within normal limits. No mass or hydronephrosis visualized. Abdominal aorta: No aneurysm visualized. Other findings: Abdominal ascites noted bilateral lower quadrant. IMPRESSION: 1. Surgical absent gallbladder.  Normal CBD. 2. Again noted diffuse increased echogenicity of the liver suspicious for fatty infiltration or chronic parenchymal liver disease. Again noted reversal of flow portal vein consistent with portal hypertension. 3. No focal hepatic mass. 4. No hydronephrosis. 5. Abdominal ascites is noted.  Electronically Signed   By: Natasha Mead M.D.   On: 06/20/2015 11:42   Dg Abd Acute W/chest  06/17/2015  CLINICAL DATA:  Jaundice. Upper abdominal pain and nausea for 1 week. Alcohol use. EXAM: DG ABDOMEN ACUTE W/ 1V CHEST COMPARISON:  Chest 01/07/2009.  CT abdomen and pelvis 06/03/2003. FINDINGS: Normal heart size and pulmonary vascularity. Blunting of the right costophrenic angle suggests a small effusion. No focal airspace disease or consolidation in the lungs. No pneumothorax. Mediastinal contours appear intact. Scattered gas and stool throughout the colon. No small or large bowel distention. No free intra-abdominal air. No abnormal air-fluid levels. Surgical clips in the right upper quadrant. Calcified phleboliths in the pelvis. No radiopaque stones. Visualized bones appear intact. IMPRESSION: Probable small right pleural effusion. Normal nonobstructive bowel gas pattern. Electronically Signed   By: Burman Nieves M.D.   On: 06/17/2015 02:50   US Abdomen Limited Ruq  06/17/2015  CLINICAL DATA:  Elevated liver enzymes EXAM: US ABDOMEN LIMITED - RIGHT UPPER QUADRANT COMPARISON:  August 15, 2013 FINDINGS: Gallbladder: Surgically absent. Common bile duct: Diameter: 3 mm. No intrahepatic or extrahepatic biliary duct dilatation. Liver: No focal lesion identified. The liver echogenicity overall is increased. Flow in the portal vein is hepatofugal, opposite of normal anatomic direction. A small amount of ascites is noted around the liver. IMPRESSION: Evidence of portal venous hypertension with reversal of flow in the portal vein. The liver has an appearance consistent with hepatic steatosis and likely underlying parenchymal liver disease. While no focal liver lesions are identified, the sensitivity of ultrasound for focal liver lesions is diminished significantly in this circumstance. A small amount of ascites surrounding the liver is noted. Gallbladder is absent. No biliary duct dilatation evident.  Electronically Signed   By: Bretta Bang III M.D.   On: 06/17/2015 07:24    Time Spent in minutes  30   Miguel Riding PA-S on 06/24/2015 at 10:47 AM     Clint Lipps Pager: [phone] 06/24/2015, 1:57 PM

## 2015-06-25 DIAGNOSIS — E440 Moderate protein-calorie malnutrition: Secondary | ICD-10-CM

## 2015-06-25 LAB — GLUCOSE, CAPILLARY
GLUCOSE-CAPILLARY: 85 mg/dL (ref 65–99)
Glucose-Capillary: 103 mg/dL — ABNORMAL HIGH (ref 65–99)
Glucose-Capillary: 107 mg/dL — ABNORMAL HIGH (ref 65–99)
Glucose-Capillary: 108 mg/dL — ABNORMAL HIGH (ref 65–99)
Glucose-Capillary: 118 mg/dL — ABNORMAL HIGH (ref 65–99)
Glucose-Capillary: 66 mg/dL (ref 65–99)
Glucose-Capillary: 94 mg/dL (ref 65–99)
Glucose-Capillary: 97 mg/dL (ref 65–99)

## 2015-06-25 LAB — COMPREHENSIVE METABOLIC PANEL
ALK PHOS: 270 U/L — AB (ref 38–126)
ALT: 77 U/L — AB (ref 17–63)
AST: 155 U/L — AB (ref 15–41)
Albumin: 2 g/dL — ABNORMAL LOW (ref 3.5–5.0)
Anion gap: 8 (ref 5–15)
BUN: 28 mg/dL — AB (ref 6–20)
CALCIUM: 8.3 mg/dL — AB (ref 8.9–10.3)
CO2: 24 mmol/L (ref 22–32)
CREATININE: 0.55 mg/dL — AB (ref 0.61–1.24)
Chloride: 103 mmol/L (ref 101–111)
Glucose, Bld: 85 mg/dL (ref 65–99)
Potassium: 3.8 mmol/L (ref 3.5–5.1)
Sodium: 135 mmol/L (ref 135–145)
Total Bilirubin: 33.9 mg/dL (ref 0.3–1.2)
Total Protein: 6.7 g/dL (ref 6.5–8.1)

## 2015-06-25 LAB — PROTIME-INR
INR: 1.59 — AB (ref 0.00–1.49)
PROTHROMBIN TIME: 19 s — AB (ref 11.6–15.2)

## 2015-06-25 NOTE — Progress Notes (Signed)
GASTROENTEROLOGY PROGRESS NOTE  Problem:   Alcoholic hepatitis  Subjective: No acute complaints voiced  Objective: Patient alert, coherent, appropriate, less tremulous than previously. No distress.  Total bilirubin has now risen to 34, which is concerning, but INR has improved to 1.59. Remains on prednisolone for treatment of his alcoholic hepatitis.  Assessment: Overall, I would say the patient is stable, not clearly improving or worsening.  Plan: Continue current management.  The patient's brother, Miguel Washington, was at the bedside (can be reached at [phone]). The patient, he, and I had a lengthy discussion regarding patient's prognosis and need for intervention to avoid resumption of alcohol. At this time, in contrast earlier, the patient seems open to a rehabilitation program, and his brother, who appears very articulate and concerned, is strongly pushing for this. Accordingly, I will request a social work consult to go over options for alcohol addiction treatment programs.  Meanwhile, in anticipation of the patient's eventual discharge and overall deconditioning, I would recommend consideration of placing a physical therapy consultation for strengthening and conditioning. I will defer this decision to the hospitalist physician.  Miguel Washington, M.D. 06/25/2015 3:32 PM  Pager [phone] If no answer or after 5 PM call [phone]

## 2015-06-25 NOTE — Progress Notes (Signed)
[facility] PROGRESS NOTE    Progress Note  Miguel Washington  GEX:[id number] DOB: 03-27-1960 DOA: 06/17/2015 PCP: No PCP Per Patient     Brief Narrative:   Miguel BottomsChristopher Washington is an 55 y.o. male past medical history of alcohol abuse with large amounts of daily alcohol consumption his last drink was in the morning of 06/17/2015, admitted for alcoholic hepatitis, encephalopathy and alcohol intoxication start her on lactulose and steroids  Assessment/Plan:   Alcoholic hepatitis/ Alcoholic liver disease (HCC): On admission his bilirubin was 21, AST 209 and ALT 47. INR 1.9 and PTT 21. Maddrey's hepatic discriminant function at 63.7; greater than 32 indicating severe disease. GI was consulted and patient will start on IV Solu-Medrol for 3 days, switched to oral prednisone on 06/21/2015 Transaminases Fluctuating we'll continue to trend.the conus trending up, GI recommended 4 weeks of steroids. Awaiting GI further recommendations. Anorexic, he relates he has no appetite.  Alcohol intoxication: No signs of withdrawal.  Acute encephalopathy: Suspect secondary to hepatic encephalopathy. Currently on lactulose as his ammonia level was 124. Initially on admission it was thought that he had spontaneous bacterial peritonitis due to a white count of 11 no paracentesis was done antibiotics were DC'd.  Hypokalemia: Replete it is started on spironolactone  HYPERTENSION, BENIGN ESSENTIAL: Continue diuretic therapy.  Hyponatremia Resolved likely due to secondary hyperaldosteronism continue spironolactone.  Prolonged Q-T interval on ECG: Repeat 12-lead EKG  Malnutrition of moderate degree Ensure 3 times a day   DVT prophylaxis: none INR elevated Family Communication:Brother Disposition Plan/Barrier to D/C: hopefully in am Code Status:     Code Status Orders        Start     Ordered   06/17/15 0557  Full code   Continuous     06/17/15 0556    Code Status History    Date Active Date Inactive Code Status Order ID Comments User Context   08/24/2013  8:49 PM 08/27/2013  4:46 PM Full Code [id number]  Alison MurrayAlma M Devine, MD Inpatient        IV Access:    Peripheral IV   Procedures and diagnostic studies:   No results found.   Medical Consultants:    None.  Anti-Infectives:   None  Subjective:    Miguel Bottomshristopher Washington he relates he doesn't have an appetite  Objective:    Filed Vitals:   06/24/15 0500 06/24/15 1355 06/24/15 2114 06/25/15 0432  BP: 140/89 140/83 152/84 147/83  Pulse: 102 102 102 104  Temp: 98.8 F (37.1 C) 98.1 F (36.7 C) 98.4 F (36.9 C) 98.2 F (36.8 C)  TempSrc: Oral Oral Oral Oral  Resp: 18 18 18 18   Height:      Weight:      SpO2: 100% 99% 98% 97%    Intake/Output Summary (Last 24 hours) at 06/25/15 1121 Last data filed at 06/25/15 0923  Gross per 24 hour  Intake      0 ml  Output    500 ml  Net   -500 ml   Filed Weights   06/17/15 0547  Weight: 69.491 kg (153 lb 3.2 oz)    Exam: General exam: In no acute distress. Respiratory system: Good air movement and clear to auscultation. Cardiovascular system: S1 & S2 heard, RRR. No JVD, murmurs, rubs, gallops or clicks.  Gastrointestinal system: Abdomen is nondistended, soft and nontender.  Central nervous system: Alert and oriented. No focal neurological deficits. Extremities: No pedal edema. Skin: No rashes, lesions or ulcers Psychiatry: Judgement and insight  appear normal. Mood & affect appropriate.    Data Reviewed:    Labs: Basic Metabolic Panel:  Recent Labs Lab 06/21/15 0456 06/22/15 0436 06/23/15 0449 06/24/15 0518 06/25/15 0502  NA 143 139 137 134* 135  K 3.2* 3.8 3.8 2.9* 3.8  CL 111 107 106 102 103  CO2 GLUCOSE 110* 89 86 80 85  BUN 22* 28*  CREATININE 0.36* 0.34* <0.30* 0.61 0.55*  CALCIUM 7.9* 8.0* 7.9* 8.2* 8.3*  MG 1.8  --   --   --   --    GFR Estimated Creatinine Clearance: 95.3 mL/min (by C-G  formula based on Cr of 0.55). Liver Function Tests:  Recent Labs Lab 06/21/15 0456 06/22/15 0436 06/23/15 0449 06/24/15 0518 06/25/15 0502  AST 119* 114* 133* 135* 155*  ALT 49 56 52 64* 77*  ALKPHOS 207* 210* 208* 252* 270*  BILITOT 25.5* 29.2* 28.4* 32.1* 33.9*  PROT 6.1* 6.6 5.6* 6.7 6.7  ALBUMIN 1.8* 1.9* 1.8* 1.9* 2.0*   No results for input(s): LIPASE, AMYLASE in the last 168 hours.  Recent Labs Lab 06/19/15 0335  AMMONIA 60*   Coagulation profile  Recent Labs Lab 06/21/15 0456 06/23/15 1225 06/24/15 0518 06/25/15 0502  INR 2.06* 1.82* 1.79* 1.59*    CBC:  Recent Labs Lab 06/19/15 0335 06/20/15 0426 06/22/15 0436  WBC 9.0 11.9* 14.6*  HGB 12.0* 11.6* 12.3*  HCT 33.3* 32.5* 35.0*  MCV 84.7 84.6 86.6  PLT 70* 98* 116*   Cardiac Enzymes: No results for input(s): CKTOTAL, CKMB, CKMBINDEX, TROPONINI in the last 168 hours. BNP (last 3 results) No results for input(s): PROBNP in the last 8760 hours. CBG:  Recent Labs Lab 06/24/15 1749 06/24/15 2126 06/25/15 0031 06/25/15 0429 06/25/15 0729  GLUCAP 100* 118* 108* 97 85   D-Dimer: No results for input(s): DDIMER in the last 72 hours. Hgb A1c: No results for input(s): HGBA1C in the last 72 hours. Lipid Profile: No results for input(s): CHOL, HDL, LDLCALC, TRIG, CHOLHDL, LDLDIRECT in the last 72 hours. Thyroid function studies: No results for input(s): TSH, T4TOTAL, T3FREE, THYROIDAB in the last 72 hours.  Invalid input(s): FREET3 Anemia work up: No results for input(s): VITAMINB12, FOLATE, FERRITIN, TIBC, IRON, RETICCTPCT in the last 72 hours. Sepsis Labs:  Recent Labs Lab 06/19/15 0335 06/20/15 0426 06/22/15 0436  WBC 9.0 11.9* 14.6*   Microbiology No results found for this or any previous visit (from the past 240 hour(s)).   Medications:   . calcium carbonate  1 tablet Oral Once  . feeding supplement (ENSURE ENLIVE)  237 mL Oral BID BM  . folic acid  1 mg Oral Daily  .  furosemide  20 mg Oral Daily  . lactulose  20 g Oral TID  . loratadine  10 mg Oral Daily  . multivitamin with minerals  1 tablet Oral Daily  . prednisoLONE  40 mg Oral Daily  . sodium chloride flush  3 mL Intravenous Q12H  . spironolactone  50 mg Oral Daily  . thiamine  100 mg Oral Daily   Continuous Infusions:   Time spent: 15 min   LOS: 8 days   Marinda Elk  [facility] Pager [phone]  *Please refer to [email or web address], password [email or web address] to get updated schedule on who will round on this patient, as hospitalists switch teams weekly. If 7PM-7AM, please contact night-coverage at [email or web address], password [email or web address] for any overnight needs.  06/25/2015, 11:21 AM

## 2015-06-26 LAB — COMPREHENSIVE METABOLIC PANEL
ALK PHOS: 278 U/L — AB (ref 38–126)
ALT: 79 U/L — AB (ref 17–63)
AST: 160 U/L — AB (ref 15–41)
Albumin: 1.8 g/dL — ABNORMAL LOW (ref 3.5–5.0)
Anion gap: 7 (ref 5–15)
BUN: 31 mg/dL — AB (ref 6–20)
CALCIUM: 8.3 mg/dL — AB (ref 8.9–10.3)
CHLORIDE: 107 mmol/L (ref 101–111)
CO2: 22 mmol/L (ref 22–32)
CREATININE: 0.82 mg/dL (ref 0.61–1.24)
Glucose, Bld: 87 mg/dL (ref 65–99)
Potassium: 3.6 mmol/L (ref 3.5–5.1)
Sodium: 136 mmol/L (ref 135–145)
Total Bilirubin: 31.8 mg/dL (ref 0.3–1.2)
Total Protein: 6.1 g/dL — ABNORMAL LOW (ref 6.5–8.1)

## 2015-06-26 LAB — PROTIME-INR
INR: 1.7 — AB (ref 0.00–1.49)
PROTHROMBIN TIME: 20 s — AB (ref 11.6–15.2)

## 2015-06-26 LAB — GLUCOSE, CAPILLARY
GLUCOSE-CAPILLARY: 76 mg/dL (ref 65–99)
GLUCOSE-CAPILLARY: 113 mg/dL — AB (ref 65–99)
GLUCOSE-CAPILLARY: 112 mg/dL — AB (ref 65–99)
Glucose-Capillary: 100 mg/dL — ABNORMAL HIGH (ref 65–99)
Glucose-Capillary: 102 mg/dL — ABNORMAL HIGH (ref 65–99)
Glucose-Capillary: 97 mg/dL (ref 65–99)

## 2015-06-26 NOTE — Progress Notes (Signed)
CSW received referral for ETOH abuse. CSW spoke with patient's brother, Berna SpareMarcus at bedside to confirm that they have already been in touch with [facility]. CSW left message for Chrissie NoaWilliam to make arrangements for patient to be admitted there once released from the hospital.    Lincoln MaxinKelly Maddelyn Rocca, LCSW [facility] Clinical Social Worker cell #: [phone]

## 2015-06-26 NOTE — Progress Notes (Signed)
[facility] PROGRESS NOTE    Progress Note  Miguel BottomsChristopher Washington  ZOX:[id number] DOB: [birth year] DOA: 06/17/2015 PCP: No PCP Per Patient     Brief Narrative:   Miguel BottomsChristopher Washington is an 55 y.o. male past medical history of alcohol abuse with large amounts of daily alcohol consumption his last drink was in the morning of 06/17/2015, admitted for alcoholic hepatitis, encephalopathy and alcohol intoxication start her on lactulose and steroids  Assessment/Plan:   Alcoholic hepatitis/ Alcoholic liver disease (HCC): GI was consulted , they agree with steroids Transaminases Fluctuating we'll continue to trend.the conus trending up, GI recommended 4 weeks of steroids.  tolerating his diet. Get a physical therapy consult as his relate he is weak   Alcohol intoxication: No signs of withdrawal.  Acute encephalopathy: Suspect secondary to hepatic encephalopathy.  resolved.alemia: Replete it is started on spironolactone  HYPERTENSION, BENIGN ESSENTIAL: Continue diuretic therapy.  Hyponatremia Resolved likely due to secondary hyperaldosteronism continue spironolactone.  Prolonged Q-T interval on ECG:  resolved   Malnutrition of moderate degree Ensure 3 times a day   DVT prophylaxis: none INR elevated Family Communication:Brother Disposition Plan/Barrier to D/C: hopefully in am Code Status:     Code Status Orders        Start     Ordered   06/17/15 0557  Full code   Continuous     06/17/15 0556    Code Status History    Date Active Date Inactive Code Status Order ID Comments User Context   08/24/2013  8:49 PM 08/27/2013  4:46 PM Full Code [id number]  Alison MurrayAlma M Devine, MD Inpatient        IV Access:    Peripheral IV   Procedures and diagnostic studies:   No results found.   Medical Consultants:    None.  Anti-Infectives:   None  Subjective:    Miguel Bottomshristopher Washington Relates he is significantly weak today, his appetite is improving.  Filed Vitals:     06/25/15 0432 06/25/15 1423 06/25/15 2225 06/26/15 0530  BP: 147/83 152/81 154/84 145/88  Pulse: 104 116 100 98  Temp: 98.2 F (36.8 C) 98.5 F (36.9 C) 98.6 F (37 C) 98.1 F (36.7 C)  TempSrc: Oral Oral Oral Oral  Resp: 18 19 17 16   Height:      Weight:      SpO2: 97% 100% 99% 98%    Intake/Output Summary (Last 24 hours) at 06/26/15 1004 Last data filed at 06/26/15 0208  Gross per 24 hour  Intake    120 ml  Output    200 ml  Net    -80 ml   Filed Weights   06/17/15 0547  Weight: 69.491 kg (153 lb 3.2 oz)    Exam: General exam: In no acute distress. Respiratory system: Good air movement and clear to auscultation. Cardiovascular system: S1 & S2 heard, RRR. No JVD, murmurs, rubs, gallops or clicks.  Gastrointestinal system: Abdomen is nondistended, soft and nontender.  Central nervous system: Alert and oriented. No focal neurological deficits. Extremities: No pedal edema. Skin: No rashes, lesions or ulcers Psychiatry: Judgement and insight appear normal. Mood & affect appropriate.    Data Reviewed:    Labs: Basic Metabolic Panel:  Recent Labs Lab 06/21/15 0456 06/22/15 0436 06/23/15 0449 06/24/15 0518 06/25/15 0502 06/26/15 0453  NA 143 139 137 134* 135 136  K 3.2* 3.8 3.8 2.9* 3.8 3.6  CL 111 107 106 102 103 107  CO2 26 26 25 24 24 22   GLUCOSE 110*  89 86 80 85 87  BUN 11 16 18  22* 28* 31*  CREATININE 0.36* 0.34* <0.30* 0.61 0.55* 0.82  CALCIUM 7.9* 8.0* 7.9* 8.2* 8.3* 8.3*  MG 1.8  --   --   --   --   --    GFR Estimated Creatinine Clearance: 92.9 mL/min (by C-G formula based on Cr of 0.82). Liver Function Tests:  Recent Labs Lab 06/22/15 0436 06/23/15 0449 06/24/15 0518 06/25/15 0502 06/26/15 0453  AST 114* 133* 135* 155* 160*  ALT 56 52 64* 77* 79*  ALKPHOS 210* 208* 252* 270* 278*  BILITOT 29.2* 28.4* 32.1* 33.9* 31.8*  PROT 6.6 5.6* 6.7 6.7 6.1*  ALBUMIN 1.9* 1.8* 1.9* 2.0* 1.8*   No results for input(s): LIPASE, AMYLASE in the  last 168 hours. No results for input(s): AMMONIA in the last 168 hours. Coagulation profile  Recent Labs Lab 06/21/15 0456 06/23/15 1225 06/24/15 0518 06/25/15 0502 06/26/15 0453  INR 2.06* 1.82* 1.79* 1.59* 1.70*    CBC:  Recent Labs Lab 06/20/15 0426 06/22/15 0436  WBC 11.9* 14.6*  HGB 11.6* 12.3*  HCT 32.5* 35.0*  MCV 84.6 86.6  PLT 98* 116*   Cardiac Enzymes: No results for input(s): CKTOTAL, CKMB, CKMBINDEX, TROPONINI in the last 168 hours. BNP (last 3 results) No results for input(s): PROBNP in the last 8760 hours. CBG:  Recent Labs Lab 06/25/15 1629 06/25/15 1953 06/26/15 0017 06/26/15 0526 06/26/15 0734  GLUCAP 94 103* 97 102* 76   D-Dimer: No results for input(s): DDIMER in the last 72 hours. Hgb A1c: No results for input(s): HGBA1C in the last 72 hours. Lipid Profile: No results for input(s): CHOL, HDL, LDLCALC, TRIG, CHOLHDL, LDLDIRECT in the last 72 hours. Thyroid function studies: No results for input(s): TSH, T4TOTAL, T3FREE, THYROIDAB in the last 72 hours.  Invalid input(s): FREET3 Anemia work up: No results for input(s): VITAMINB12, FOLATE, FERRITIN, TIBC, IRON, RETICCTPCT in the last 72 hours. Sepsis Labs:  Recent Labs Lab 06/20/15 0426 06/22/15 0436  WBC 11.9* 14.6*   Microbiology No results found for this or any previous visit (from the past 240 hour(s)).   Medications:   . calcium carbonate  1 tablet Oral Once  . feeding supplement (ENSURE ENLIVE)  237 mL Oral BID BM  . folic acid  1 mg Oral Daily  . furosemide  20 mg Oral Daily  . lactulose  20 g Oral TID  . loratadine  10 mg Oral Daily  . multivitamin with minerals  1 tablet Oral Daily  . prednisoLONE  40 mg Oral Daily  . sodium chloride flush  3 mL Intravenous Q12H  . spironolactone  50 mg Oral Daily  . thiamine  100 mg Oral Daily   Continuous Infusions:   Time spent: 15 min   LOS: 9 days   Marinda ElkFELIZ ORTIZ, Dameshia Seybold  [facility] Pager [phone]  *Please  refer to [email or web address], password [email or web address] to get updated schedule on who will round on this patient, as hospitalists switch teams weekly. If 7PM-7AM, please contact night-coverage at [email or web address], password [email or web address] for any overnight needs.  06/26/2015, 10:04 AM

## 2015-06-26 NOTE — Progress Notes (Signed)
No complaint of pain or SOB.  Pt sitting up in chair, eating breakfast, NAD.  Awake and alert, but slightly incoherent--can only do serial 2's with abou 60% accuracy.  LABS:  INR back up a little, but T bili finally a tiny bit better--?beginning of a trend toward improvement, vs lab variation.  RECOMM:  See yesterday's note  Florencia Reasonsobert V. Abygale Karpf, M.D. Pager [phone] If no answer or after 5 PM call [phone]

## 2015-06-26 NOTE — Progress Notes (Signed)
Physical Therapy Treatment Patient Details Name: Miguel Washington MRN: [id number] DOB: 1960-06-06 Today's Date: 06/26/2015    History of Present Illness Miguel BottomsChristopher Washington is a 55 y.o. male patient came to the emergency room with a complaint of weakness. He has been drinking daily a large amount of alcohol. He states that his last drink was the morning of 6/13. Patient drinks more than he eats. Admitted with alcoholic hepatitis, encephalopathy, alcohol intoxication    PT Comments    Pt ambulated 400 feet around unit with RW.  Pt likely to d/c to [facility] per staff.  Follow Up Recommendations  No PT follow up     Equipment Recommendations  Rolling walker with 5" wheels    Recommendations for Other Services       Precautions / Restrictions Precautions Precautions: Fall    Mobility  Bed Mobility Overal bed mobility: Needs Assistance Bed Mobility: Supine to Sit;Sit to Supine     Supine to sit: Supervision;HOB elevated Sit to supine: Supervision   General bed mobility comments: incr time, cues for task completion  Transfers Overall transfer level: Needs assistance Equipment used: Rolling walker (2 wheeled) Transfers: Sit to/from Stand Sit to Stand: Min guard         General transfer comment: cues for hand placement  Ambulation/Gait Ambulation/Gait assistance: Min guard Ambulation Distance (Feet): 400 Feet Assistive device: Rolling walker (2 wheeled) Gait Pattern/deviations: Step-through pattern;Decreased stride length;Trunk flexed     General Gait Details: verbal cues for use of RW, appears steady with RW, tolerated good distance today   Stairs            Wheelchair Mobility    Modified Rankin (Stroke Patients Only)       Balance                                    Cognition Arousal/Alertness: Awake/alert Behavior During Therapy: WFL for tasks assessed/performed Overall Cognitive Status: Within Functional  Limits for tasks assessed                      Exercises      General Comments        Pertinent Vitals/Pain Pain Assessment: No/denies pain    Home Living                      Prior Function            PT Goals (current goals can now be found in the care plan section) Progress towards PT goals: Progressing toward goals    Frequency  Min 3X/week    PT Plan Current plan remains appropriate    Co-evaluation             End of Session Equipment Utilized During Treatment: Gait belt Activity Tolerance: Patient tolerated treatment well Patient left: in bed;with call bell/phone within reach;with bed alarm set     Time: [id number] PT Time Calculation (min) (ACUTE ONLY): 12 min  Charges:  $Gait Training: 8-22 mins                    G Codes:      Katherene Dinino,KATHrine E 06/26/2015, 4:46 PM Zenovia JarredKati Ernst Cumpston, PT, DPT 06/26/2015 Pager: [phone]

## 2015-06-27 DIAGNOSIS — I10 Essential (primary) hypertension: Secondary | ICD-10-CM

## 2015-06-27 LAB — COMPREHENSIVE METABOLIC PANEL
ALT: 90 U/L — AB (ref 17–63)
AST: 163 U/L — ABNORMAL HIGH (ref 15–41)
Albumin: 1.7 g/dL — ABNORMAL LOW (ref 3.5–5.0)
Alkaline Phosphatase: 266 U/L — ABNORMAL HIGH (ref 38–126)
Anion gap: 8 (ref 5–15)
BILIRUBIN TOTAL: 42.8 mg/dL — AB (ref 0.3–1.2)
BUN: 35 mg/dL — AB (ref 6–20)
CHLORIDE: 102 mmol/L (ref 101–111)
CO2: 22 mmol/L (ref 22–32)
CREATININE: 0.97 mg/dL (ref 0.61–1.24)
Calcium: 8 mg/dL — ABNORMAL LOW (ref 8.9–10.3)
Glucose, Bld: 106 mg/dL — ABNORMAL HIGH (ref 65–99)
Potassium: 3.3 mmol/L — ABNORMAL LOW (ref 3.5–5.1)
Sodium: 132 mmol/L — ABNORMAL LOW (ref 135–145)
TOTAL PROTEIN: 5.9 g/dL — AB (ref 6.5–8.1)

## 2015-06-27 LAB — GLUCOSE, CAPILLARY
GLUCOSE-CAPILLARY: 92 mg/dL (ref 65–99)
Glucose-Capillary: 100 mg/dL — ABNORMAL HIGH (ref 65–99)
Glucose-Capillary: 107 mg/dL — ABNORMAL HIGH (ref 65–99)
Glucose-Capillary: 121 mg/dL — ABNORMAL HIGH (ref 65–99)
Glucose-Capillary: 96 mg/dL (ref 65–99)
Glucose-Capillary: 98 mg/dL (ref 65–99)

## 2015-06-27 LAB — PROTIME-INR
INR: 1.74 — AB (ref 0.00–1.49)
PROTHROMBIN TIME: 20.3 s — AB (ref 11.6–15.2)

## 2015-06-27 MED ORDER — SPIRONOLACTONE 25 MG PO TABS
100.0000 mg | ORAL_TABLET | Freq: Every day | ORAL | Status: DC
  Administered 2015-06-28 – 2015-06-29 (×2): 100 mg via ORAL
  Filled 2015-06-27 (×2): qty 4

## 2015-06-27 MED ORDER — FUROSEMIDE 40 MG PO TABS
40.0000 mg | ORAL_TABLET | Freq: Every day | ORAL | Status: DC
  Administered 2015-06-28 – 2015-06-29 (×2): 40 mg via ORAL
  Filled 2015-06-27 (×2): qty 1

## 2015-06-27 NOTE — Progress Notes (Addendum)
[facility] PROGRESS NOTE    Progress Note  Miguel BottomsChristopher Washington  ZOX:[id number] DOB: Sep 21, 1960 DOA: 06/17/2015 PCP: No PCP Per Patient     Brief Narrative:   Miguel BottomsChristopher Amborn is an 55 y.o. male past medical history of alcohol abuse with large amounts of daily alcohol consumption his last drink was in the morning of 06/17/2015, admitted for alcoholic hepatitis, encephalopathy and alcohol intoxication start her on lactulose and steroids  Assessment/Plan:   Alcoholic hepatitis/ Alcoholic liver disease (HCC): GI was consulted , they agree with steroids GI recommended 4 weeks of steroids. Lab values continues to be high, INR has increased to 1.7, will like to see lab plateau or trending down before d/c. Social worker was consulted and [facility]  Alcohol intoxication: No signs of withdrawal.  Acute encephalopathy: Suspect secondary to hepatic encephalopathy.  resolved   HYPERTENSION, BENIGN ESSENTIAL: Continue diuretic therapy.  Hyponatremia Resolved likely due to secondary hyperaldosteronism continue spironolactone.  Prolonged Q-T interval on ECG:  resolved   Malnutrition of moderate degree Ensure 3 times a day   DVT prophylaxis: none INR elevated Family Communication:Brother [phone] Disposition Plan/Barrier to D/C: Once labs stable or trending down. Code Status:     Code Status Orders        Start     Ordered   06/17/15 0557  Full code   Continuous     06/17/15 0556    Code Status History    Date Active Date Inactive Code Status Order ID Comments User Context   08/24/2013  8:49 PM 08/27/2013  4:46 PM Full Code [id number]  Alison MurrayAlma M Devine, MD Inpatient        IV Access:    Peripheral IV   Procedures and diagnostic studies:   No results found.   Medical Consultants:    None.  Anti-Infectives:   None  Subjective:    Miguel Bottomshristopher Washington Relates he is significantly weak today, his appetite is  improving.  Filed Vitals:   06/26/15 0530 06/26/15 1453 06/26/15 2040 06/27/15 0548  BP: 145/88 142/83 146/83 140/78  Pulse: 98 89 93 99  Temp: 98.1 F (36.7 C) 97.9 F (36.6 C) 98.4 F (36.9 C) 98.2 F (36.8 C)  TempSrc: Oral Oral Oral Oral  Resp: 16 20 18 18   Height:      Weight:      SpO2: 98% 100% 99% 99%    Intake/Output Summary (Last 24 hours) at 06/27/15 1124 Last data filed at 06/26/15 1300  Gross per 24 hour  Intake    240 ml  Output      0 ml  Net    240 ml   Filed Weights   06/17/15 0547  Weight: 69.491 kg (153 lb 3.2 oz)    Exam: General exam: In no acute distress. Respiratory system: Good air movement and clear to auscultation. Cardiovascular system: S1 & S2 heard, RRR. No JVD, murmurs, rubs, gallops or clicks.  Gastrointestinal system: Abdomen is nondistended, soft and nontender.  Central nervous system: Alert and oriented. No focal neurological deficits. Extremities: No pedal edema. Skin: No rashes, lesions or ulcers Psychiatry: Judgement and insight appear normal. Mood & affect appropriate.    Data Reviewed:    Labs: Basic Metabolic Panel:  Recent Labs Lab 06/21/15 0456  06/23/15 0449 06/24/15 0518 06/25/15 0502 06/26/15 0453 06/27/15 0452  NA 143  < > 137 134* 135 136 132*  K 3.2*  < > 3.8 2.9* 3.8 3.6 3.3*  CL  111  < > 106 102 103 107 102  CO2 26  < > 25 24 24 22 22   GLUCOSE 110*  < > 86 80 85 87 106*  BUN 11  < > 18 22* 28* 31* 35*  CREATININE 0.36*  < > <0.30* 0.61 0.55* 0.82 0.97  CALCIUM 7.9*  < > 7.9* 8.2* 8.3* 8.3* 8.0*  MG 1.8  --   --   --   --   --   --   < > = values in this interval not displayed. GFR Estimated Creatinine Clearance: 78.6 mL/min (by C-G formula based on Cr of 0.97). Liver Function Tests:  Recent Labs Lab 06/23/15 0449 06/24/15 0518 06/25/15 0502 06/26/15 0453 06/27/15 0452  AST 133* 135* 155* 160* 163*  ALT 52 64* 77* 79* 90*  ALKPHOS 208* 252* 270* 278* 266*  BILITOT 28.4* 32.1* 33.9* 31.8*  42.8*  PROT 5.6* 6.7 6.7 6.1* 5.9*  ALBUMIN 1.8* 1.9* 2.0* 1.8* 1.7*   No results for input(s): LIPASE, AMYLASE in the last 168 hours. No results for input(s): AMMONIA in the last 168 hours. Coagulation profile  Recent Labs Lab 06/23/15 1225 06/24/15 0518 06/25/15 0502 06/26/15 0453 06/27/15 0452  INR 1.82* 1.79* 1.59* 1.70* 1.74*    CBC:  Recent Labs Lab 06/22/15 0436  WBC 14.6*  HGB 12.3*  HCT 35.0*  MCV 86.6  PLT 116*   Cardiac Enzymes: No results for input(s): CKTOTAL, CKMB, CKMBINDEX, TROPONINI in the last 168 hours. BNP (last 3 results) No results for input(s): PROBNP in the last 8760 hours. CBG:  Recent Labs Lab 06/26/15 1849 06/26/15 2038 06/27/15 0046 06/27/15 0545 06/27/15 0831  GLUCAP 100* 112* 100* 107* 92   D-Dimer: No results for input(s): DDIMER in the last 72 hours. Hgb A1c: No results for input(s): HGBA1C in the last 72 hours. Lipid Profile: No results for input(s): CHOL, HDL, LDLCALC, TRIG, CHOLHDL, LDLDIRECT in the last 72 hours. Thyroid function studies: No results for input(s): TSH, T4TOTAL, T3FREE, THYROIDAB in the last 72 hours.  Invalid input(s): FREET3 Anemia work up: No results for input(s): VITAMINB12, FOLATE, FERRITIN, TIBC, IRON, RETICCTPCT in the last 72 hours. Sepsis Labs:  Recent Labs Lab 06/22/15 0436  WBC 14.6*   Microbiology No results found for this or any previous visit (from the past 240 hour(s)).   Medications:   . calcium carbonate  1 tablet Oral Once  . feeding supplement (ENSURE ENLIVE)  237 mL Oral BID BM  . folic acid  1 mg Oral Daily  . [START ON 06/28/2015] furosemide  40 mg Oral Daily  . lactulose  20 g Oral TID  . loratadine  10 mg Oral Daily  . multivitamin with minerals  1 tablet Oral Daily  . prednisoLONE  40 mg Oral Daily  . sodium chloride flush  3 mL Intravenous Q12H  . [START ON 06/28/2015] spironolactone  100 mg Oral Daily  . thiamine  100 mg Oral Daily   Continuous Infusions:    Time spent: 15 min   LOS: 10 days   Marinda ElkFELIZ ORTIZ, ABRAHAM  [facility] Pager [phone]  *Please refer to [email or web address], password [email or web address] to get updated schedule on who will round on this patient, as hospitalists switch teams weekly. If 7PM-7AM, please contact night-coverage at [email or web address], password [email or web address] for any overnight needs.  06/27/2015, 11:24 AM

## 2015-06-27 NOTE — Progress Notes (Signed)
Nutrition Follow-up  DOCUMENTATION CODES:   Non-severe (moderate) malnutrition in context of chronic illness  INTERVENTION:  -Ensure Enlive po BID, each supplement provides 350 kcal and 20 grams of protein -RD to continue to monitor PO intake  NUTRITION DIAGNOSIS:   Increased nutrient needs related to other (see comment) (ETOH abuse) as evidenced by estimated needs. -ongoing  GOAL:   Patient will meet greater than or equal to 90% of their needs -progressing  MONITOR:   PO intake, Supplement acceptance, Labs, Weight trends, I & O's  REASON FOR ASSESSMENT:   Malnutrition Screening Tool    ASSESSMENT:   55 y.o. male patient came to the emergency room with a complaint of weakness. He has been drinking daily a large amount of alcohol. He states that his last drink was the morning of admission. Patient drinks more than he eats.   Mr. Randol KernCarroll's po intake is improving Swallowing is ok - SLP signed off Still exhibits significant weakness, but feels it doesn't prevent him from eating Main complaint is he just "can't eat a lot.: States when he does start eating nurses encourage him to eat a lot (great job!) He is consuming Ensure regularly, no concerns there. PO intake of trays is still low, probably 25% at the most, but he's improving.  Labs and Medications reviewed.  Diet Order:  Diet regular Room service appropriate?: Yes; Fluid consistency:: Thin  Skin:  Reviewed, no issues  Last BM:  6/19  Height:   Ht Readings from Last 1 Encounters:  06/17/15 5\' 6"  (1.676 m)    Weight:   Wt Readings from Last 1 Encounters:  06/17/15 153 lb 3.2 oz (69.491 kg)    Ideal Body Weight:  64.5 kg  BMI:  Body mass index is 24.74 kg/(m^2).  Estimated Nutritional Needs:   Kcal:  2100-2300  Protein:  105-115g  Fluid:  2.3L/day  EDUCATION NEEDS:   No education needs identified at this time  Dionne AnoWilliam M. Joneric Streight, MS, RD LDN Inpatient Clinical Dietitian Pager [phone]

## 2015-06-27 NOTE — Progress Notes (Signed)
GASTROENTEROLOGY PROGRESS NOTE  Problem:   Alcoholic hepatitis.  Subjective: Feels ok; no complaints.  Objective: Pt in bed, alert, good spirits.  No asterixis, does serial 2's somewhat faster/better than previously but still "stumbles" when he gets to "90 minus 2".  Abd mod distended, but tympanitic--no flank dullness to suggest ascites (?gas due to lactulose).  2-3 +   Tibial edema.  INR stable at 1.74 BUT  Total bilirubin is up from 32 to 43 overnight (???? Lab error).   Assessment: 1.  Alcoholic hepatitis, on prednisolone, clinically stable--no overt improvement or worsening after about 1 wk of treatment 2.  Marked bump in bilirubin over past 24 hrs-- ?spurious 3.  Moderate fluid retention  Plan: 1. Monitor labs and clinical status 2. Increase diuretic therapy--will double furosemide to 40 qAM and Spironolactone to 100 qAM 3. Plans for [facility] noted; also, pt is getting physical therapy. 4. Dr. Dulce Sellarutlaw covering for us this weekend.  Florencia Reasonsobert V. Avonelle Viveros, M.D. 06/27/2015 10:28 AM  Pager [phone] If no answer or after 5 PM call [phone]

## 2015-06-28 LAB — COMPREHENSIVE METABOLIC PANEL
ALBUMIN: 1.8 g/dL — AB (ref 3.5–5.0)
ALK PHOS: 295 U/L — AB (ref 38–126)
ALT: 105 U/L — AB (ref 17–63)
AST: 184 U/L — AB (ref 15–41)
Anion gap: 10 (ref 5–15)
BILIRUBIN TOTAL: 44.6 mg/dL — AB (ref 0.3–1.2)
BUN: 39 mg/dL — AB (ref 6–20)
CALCIUM: 8 mg/dL — AB (ref 8.9–10.3)
CO2: 22 mmol/L (ref 22–32)
CREATININE: 1.06 mg/dL (ref 0.61–1.24)
Chloride: 102 mmol/L (ref 101–111)
GFR calc Af Amer: >60 mL/min (ref >60)
GFR calc non Af Amer: >60 mL/min (ref >60)
GLUCOSE: 87 mg/dL (ref 65–99)
Potassium: 3 mmol/L — ABNORMAL LOW (ref 3.5–5.1)
SODIUM: 134 mmol/L — AB (ref 135–145)
TOTAL PROTEIN: 6.1 g/dL — AB (ref 6.5–8.1)

## 2015-06-28 LAB — CBC
HCT: 36.2 % — ABNORMAL LOW (ref 39.0–52.0)
Hemoglobin: 13 g/dL (ref 13.0–17.0)
MCH: 30.6 pg (ref 26.0–34.0)
MCHC: 35.9 g/dL (ref 30.0–36.0)
MCV: 85.2 fL (ref 78.0–100.0)
Platelets: 89 10*3/uL — ABNORMAL LOW (ref 150–400)
RBC: 4.25 MIL/uL (ref 4.22–5.81)
RDW: 21.7 % — AB (ref 11.5–15.5)
WBC: 23.3 10*3/uL — ABNORMAL HIGH (ref 4.0–10.5)

## 2015-06-28 LAB — GLUCOSE, CAPILLARY
GLUCOSE-CAPILLARY: 77 mg/dL (ref 65–99)
GLUCOSE-CAPILLARY: 94 mg/dL (ref 65–99)
Glucose-Capillary: 93 mg/dL (ref 65–99)
Glucose-Capillary: 100 mg/dL — ABNORMAL HIGH (ref 65–99)

## 2015-06-28 LAB — PROTIME-INR
INR: 1.72 — ABNORMAL HIGH (ref 0.00–1.49)
Prothrombin Time: 20.1 seconds — ABNORMAL HIGH (ref 11.6–15.2)

## 2015-06-28 MED ORDER — POTASSIUM CHLORIDE CRYS ER 20 MEQ PO TBCR
40.0000 meq | EXTENDED_RELEASE_TABLET | Freq: Two times a day (BID) | ORAL | Status: AC
  Administered 2015-06-28 (×2): 40 meq via ORAL
  Filled 2015-06-28 (×2): qty 2

## 2015-06-28 NOTE — Progress Notes (Signed)
Subjective: No abdominal pain.  Tolerating diet. Feels weak.  Objective: Vital signs in last 24 hours: Temp:  [97.9 F (36.6 C)-98.7 F (37.1 C)] 98.4 F (36.9 C) (06/24 0450) Pulse Rate:  [92-96] 94 (06/24 0450) Resp:  [18] 18 (06/24 0450) BP: (143-157)/(84-95) 157/95 mmHg (06/24 0450) SpO2:  [98 %-100 %] 100 % (06/24 0450) Weight change:  Last BM Date: 06/27/15  PE: GEN:  NAD HEENT:  Intense scleral icterus ABD:  Distended with ascites, non-tender EXT:  2+ edema lower extremities NEURO:  Trace asterixis, no encephalopathy  Lab Results: CBC    Component Value Date/Time   WBC 23.3* 06/28/2015 0430   WBC 3.3* 12/07/2012 1849   RBC 4.25 06/28/2015 0430   RBC 4.86 12/07/2012 1849   HGB 13.0 06/28/2015 0430   HGB 14.6 12/07/2012 1849   HCT 36.2* 06/28/2015 0430   HCT 46.6 12/07/2012 1849   PLT 89* 06/28/2015 0430   MCV 85.2 06/28/2015 0430   MCV 95.8 12/07/2012 1849   MCH 30.6 06/28/2015 0430   MCH 30.0 12/07/2012 1849   MCHC 35.9 06/28/2015 0430   MCHC 31.3* 12/07/2012 1849   RDW 21.7* 06/28/2015 0430   LYMPHSABS 0.4* 08/24/2013 2218   MONOABS 1.1* 08/24/2013 2218   EOSABS 0.0 08/24/2013 2218   BASOSABS 0.0 08/24/2013 2218   CMP     Component Value Date/Time   NA 134* 06/28/2015 0430   K 3.0* 06/28/2015 0430   CL 102 06/28/2015 0430   CO2 22 06/28/2015 0430   GLUCOSE 87 06/28/2015 0430   BUN 39* 06/28/2015 0430   CREATININE 1.06 06/28/2015 0430   CREATININE 0.82 12/07/2012 1843   CALCIUM 8.0* 06/28/2015 0430   PROT 6.1* 06/28/2015 0430   ALBUMIN 1.8* 06/28/2015 0430   AST 184* 06/28/2015 0430   ALT 105* 06/28/2015 0430   ALKPHOS 295* 06/28/2015 0430   BILITOT 44.6* 06/28/2015 0430   GFRNONAA >60 06/28/2015 0430   GFRAA >60 06/28/2015 0430   Assessment:  1.  Alcoholic hepatitis, severe by lab parameters but clinically patient looks better than expected, can carry on coherent conversation and has no severe electrolyte or renal troubles and only  mild-to-moderate coagulopathy. On prednisolone. 2.  Elevated LFTs, from #1 above.  Bilirubin disproportionately elevated, but ALP, AST, and ALT all continue to slowly rise. 3.  Alcohol abuse.  No signs of alcohol withdrawal.  Plan:  1.  Correct hypokalemia. 2.  Low sodium in diet, continue diuretics. 3.  On prednisolone, continue for 4-week course at 40 mg /day (has had about one week's worth so far), and then 2-week gradual taper (for total 6-week course of therapy). 4.  PT to help gauge patient's functional status. 5.  Goal is [facility], maybe early next week (I don't see how this practically could be coordinated over the weekend) if patient meets medical clearance criteria, and assuming patient has no downturn in his clinical status over the weekend. 6.  I told patient that his functional status is the principal matter keeping him in the hospital, and that daily inspections of his labs as an inpatient (assuming he does not develop encephalopathy or other change in his status) is not necessary. 7.  Alcohol abstinence was emphasized; while patient's recovery is by no means secure or guaranteed from his current liver injury, further alcohol consumption would almost certainly guarantee death within 30 days. 8.  Will revisit tomorrow.   Freddy JakschOUTLAW,Jetaun Colbath M 06/28/2015, 11:39 AM  Pager [phone] If no answer or after 5 PM call [phone]

## 2015-06-28 NOTE — Progress Notes (Signed)
[facility] PROGRESS NOTE    Progress Note  Miguel Washington  WUJ:[id number] DOB: Mar 07, 1960 DOA: 06/17/2015 PCP: No PCP Per Patient     Brief Narrative:   Miguel BottomsChristopher Washington is an 55 y.o. male past medical history of alcohol abuse with large amounts of daily alcohol consumption his last drink was in the morning of 06/17/2015, admitted for alcoholic hepatitis, encephalopathy and alcohol intoxication start her on lactulose and steroids  Assessment/Plan:   Alcoholic hepatitis/ Alcoholic liver disease (HCC): GI was consulted , they agree with steroids GI recommended 4 weeks of steroids. Biochemical markersn especially the bilirubin continues to go up. Has a mild leukocytosis but this probably due to steroids. PT and INR continues to be 1.7 Child psychotherapistocial worker was consulted and recommended plans to be DC to [facility].  Alcohol intoxication: No signs of withdrawal.  Acute encephalopathy: Suspect secondary to hepatic encephalopathy. Resolved  HYPERTENSION, BENIGN ESSENTIAL: Continue diuretic therapy.  Hyponatremia Resolved likely due to secondary hyperaldosteronism continue spironolactone.  Prolonged Q-T interval on ECG:  resolved   Malnutrition of moderate degree Ensure 3 times a day   DVT prophylaxis: none INR elevated Family Communication:Brother [phone] Disposition Plan/Barrier to D/C: Once labs stable or trending down or plateau, to [facility]. Code Status:     Code Status Orders        Start     Ordered   06/17/15 0557  Full code   Continuous     06/17/15 0556    Code Status History    Date Active Date Inactive Code Status Order ID Comments User Context   08/24/2013  8:49 PM 08/27/2013  4:46 PM Full Code [id number]  Alison MurrayAlma M Devine, MD Inpatient        IV Access:    Peripheral IV   Procedures and diagnostic studies:   No results found.   Medical Consultants:    None.  Anti-Infectives:   None  Subjective:     Miguel Bottomshristopher Washington appetite improves feels with more strength.  Filed Vitals:   06/27/15 0548 06/27/15 1353 06/27/15 2058 06/28/15 0450  BP: 140/78 143/84 145/84 157/95  Pulse: 99 92 96 94  Temp: 98.2 F (36.8 C) 97.9 F (36.6 C) 98.7 F (37.1 C) 98.4 F (36.9 C)  TempSrc: Oral Oral Oral Oral  Resp: 18 18 18 18   Height:      Weight:      SpO2: 99% 98% 100% 100%    Intake/Output Summary (Last 24 hours) at 06/28/15 1212 Last data filed at 06/28/15 0959  Gross per 24 hour  Intake     25 ml  Output    310 ml  Net   -285 ml   Filed Weights   06/17/15 0547  Weight: 69.491 kg (153 lb 3.2 oz)    Exam: General exam: In no acute distress. Respiratory system: Good air movement and clear to auscultation. Cardiovascular system: S1 & S2 heard, RRR.  Gastrointestinal system: Abdomen is nondistended, soft and nontender.  Central nervous system: Alert and oriented. No focal neurological deficits. Extremities: No pedal edema. Skin: No rashes, lesions or ulcers Psychiatry: Judgement and insight appear normal. Mood & affect appropriate.    Data Reviewed:    Labs: Basic Metabolic Panel:  Recent Labs Lab 06/24/15 0518 06/25/15 0502 06/26/15 0453 06/27/15 0452 06/28/15 0430  NA 134* 135 136 132* 134*  K 2.9* 3.8 3.6 3.3* 3.0*  CL 102 103 107 102 102  CO2 24 24 22 22 22   GLUCOSE 80 85 87  106* 87  BUN 22* 28* 31* 35* 39*  CREATININE 0.61 0.55* 0.82 0.97 1.06  CALCIUM 8.2* 8.3* 8.3* 8.0* 8.0*   GFR Estimated Creatinine Clearance: 71.9 mL/min (by C-G formula based on Cr of 1.06). Liver Function Tests:  Recent Labs Lab 06/24/15 0518 06/25/15 0502 06/26/15 0453 06/27/15 0452 06/28/15 0430  AST 135* 155* 160* 163* 184*  ALT 64* 77* 79* 90* 105*  ALKPHOS 252* 270* 278* 266* 295*  BILITOT 32.1* 33.9* 31.8* 42.8* 44.6*  PROT 6.7 6.7 6.1* 5.9* 6.1*  ALBUMIN 1.9* 2.0* 1.8* 1.7* 1.8*   No results for input(s): LIPASE, AMYLASE in the last 168 hours. No results for  input(s): AMMONIA in the last 168 hours. Coagulation profile  Recent Labs Lab 06/24/15 0518 06/25/15 0502 06/26/15 0453 06/27/15 0452 06/28/15 0430  INR 1.79* 1.59* 1.70* 1.74* 1.72*    CBC:  Recent Labs Lab 06/22/15 0436 06/28/15 0430  WBC 14.6* 23.3*  HGB 12.3* 13.0  HCT 35.0* 36.2*  MCV 86.6 85.2  PLT 116* 89*   Cardiac Enzymes: No results for input(s): CKTOTAL, CKMB, CKMBINDEX, TROPONINI in the last 168 hours. BNP (last 3 results) No results for input(s): PROBNP in the last 8760 hours. CBG:  Recent Labs Lab 06/27/15 1136 06/27/15 1729 06/27/15 2057 06/28/15 0750 06/28/15 1124  GLUCAP 98 96 121* 77 94   D-Dimer: No results for input(s): DDIMER in the last 72 hours. Hgb A1c: No results for input(s): HGBA1C in the last 72 hours. Lipid Profile: No results for input(s): CHOL, HDL, LDLCALC, TRIG, CHOLHDL, LDLDIRECT in the last 72 hours. Thyroid function studies: No results for input(s): TSH, T4TOTAL, T3FREE, THYROIDAB in the last 72 hours.  Invalid input(s): FREET3 Anemia work up: No results for input(s): VITAMINB12, FOLATE, FERRITIN, TIBC, IRON, RETICCTPCT in the last 72 hours. Sepsis Labs:  Recent Labs Lab 06/22/15 0436 06/28/15 0430  WBC 14.6* 23.3*   Microbiology No results found for this or any previous visit (from the past 240 hour(s)).   Medications:   . calcium carbonate  1 tablet Oral Once  . feeding supplement (ENSURE ENLIVE)  237 mL Oral BID BM  . folic acid  1 mg Oral Daily  . furosemide  40 mg Oral Daily  . lactulose  20 g Oral TID  . loratadine  10 mg Oral Daily  . multivitamin with minerals  1 tablet Oral Daily  . prednisoLONE  40 mg Oral Daily  . sodium chloride flush  3 mL Intravenous Q12H  . spironolactone  100 mg Oral Daily  . thiamine  100 mg Oral Daily   Continuous Infusions:   Time spent: 15 min   LOS: 11 days   Marinda ElkFELIZ ORTIZ, Caera Enwright  [facility] Pager [phone]  *Please refer to [email or web address], password [email or web address]  to get updated schedule on who will round on this patient, as hospitalists switch teams weekly. If 7PM-7AM, please contact night-coverage at [email or web address], password [email or web address] for any overnight needs.  06/28/2015, 12:12 PM

## 2015-06-29 LAB — GLUCOSE, CAPILLARY
GLUCOSE-CAPILLARY: 79 mg/dL (ref 65–99)
GLUCOSE-CAPILLARY: 85 mg/dL (ref 65–99)
GLUCOSE-CAPILLARY: 101 mg/dL — AB (ref 65–99)
Glucose-Capillary: 87 mg/dL (ref 65–99)

## 2015-06-29 LAB — PROTIME-INR
INR: 1.78 — AB (ref 0.00–1.49)
Prothrombin Time: 20.7 seconds — ABNORMAL HIGH (ref 11.6–15.2)

## 2015-06-29 MED ORDER — FUROSEMIDE 20 MG PO TABS: 20.0000 mg | ORAL_TABLET | Freq: Every day | ORAL | Status: DC

## 2015-06-29 NOTE — Progress Notes (Signed)
[facility] PROGRESS NOTE    Progress Note  Miguel Washington  ZOX:[id number] DOB: December 22, 1960 DOA: 06/17/2015 PCP: No PCP Per Patient     Brief Narrative:   Miguel Washington is an 55 y.o. male past medical history of alcohol abuse with large amounts of daily alcohol consumption his last drink was in the morning of 06/17/2015, admitted for alcoholic hepatitis, encephalopathy and alcohol intoxication start her on lactulose and steroids  Assessment/Plan:   Alcoholic hepatitis/ Alcoholic liver disease (HCC): GI was consulted , they agree with steroids GI recommended 4 weeks of steroids. Biochemical markers improving.  [facility]. Decrease diuretic therapy as there has been a rise in his creatinine continue check a basic medical buttock panel  Alcohol intoxication: No signs of withdrawal.  Acute encephalopathy: Suspect secondary to hepatic encephalopathy. Resolved  HYPERTENSION, BENIGN ESSENTIAL: Continue diuretic therapy at a lower dose.  Hyponatremia Likely due to secondary hyperaldosteronism continue spironolactone. Decreasing likely due to diuretic therapy will decrease furosemide. Hold today resume tomorrow morning.  Prolonged Q-T interval on ECG: Resolved   Malnutrition of moderate degree Ensure 3 times a day   DVT prophylaxis: none INR elevated Family Communication:Brother [phone] Disposition Plan/Barrier to D/C: Hopefully in the morning. Code Status:     Code Status Orders        Start     Ordered   06/17/15 0557  Full code   Continuous     06/17/15 0556    Code Status History    Date Active Date Inactive Code Status Order ID Comments User Context   08/24/2013  8:49 PM 08/27/2013  4:46 PM Full Code [id number]  Alison MurrayAlma M Devine, MD Inpatient        IV Access:    Peripheral IV   Procedures and diagnostic studies:   No results found.   Medical Consultants:    None.  Anti-Infectives:    None  Subjective:    Miguel Bottomshristopher Washington appetite improves feels with more strength.  Filed Vitals:   06/28/15 0450 06/28/15 1521 06/28/15 2125 06/29/15 0522  BP: 157/95 133/86 143/88 148/92  Pulse: 94 92 90 96  Temp: 98.4 F (36.9 C) 98.1 F (36.7 C) 98.1 F (36.7 C) 98.3 F (36.8 C)  TempSrc: Oral Oral Oral Oral  Resp: 18 18 18 16   Height:      Weight:      SpO2: 100% 100% 100% 100%    Intake/Output Summary (Last 24 hours) at 06/29/15 0945 Last data filed at 06/29/15 0254  Gross per 24 hour  Intake    600 ml  Output    150 ml  Net    450 ml   Filed Weights   06/17/15 0547  Weight: 69.491 kg (153 lb 3.2 oz)    Exam: General exam: In no acute distress.Less jaundiced Respiratory system: Good air movement and clear to auscultation. Cardiovascular system: S1 & S2 heard, RRR.  Gastrointestinal system: Abdomen is nondistended, soft and nontender.  Central nervous system: Alert and oriented. No focal neurological deficits. Extremities: No pedal edema. Skin: No rashes, lesions or ulcers Psychiatry: Judgement and insight appear normal. Mood & affect appropriate.    Data Reviewed:    Labs: Basic Metabolic Panel:  Recent Labs Lab 06/25/15 0502 06/26/15 0453 06/27/15 0452 06/28/15 0430 06/29/15 0506  NA 135 136 132* 134* 133*  K 3.8 3.6 3.3* 3.0* 3.9  CL 103 107 102 102 104  CO2 24 22 22 22  20*  GLUCOSE  85 87 106* 87 79  BUN 28* 31* 35* 39* 44*  CREATININE 0.55* 0.82 0.97 1.06 1.66*  CALCIUM 8.3* 8.3* 8.0* 8.0* 7.6*   GFR Estimated Creatinine Clearance: 45.9 mL/min (by C-G formula based on Cr of 1.66). Liver Function Tests:  Recent Labs Lab 06/25/15 0502 06/26/15 0453 06/27/15 0452 06/28/15 0430 06/29/15 0506  AST 155* 160* 163* 184* 179*  ALT 77* 79* 90* 105* 112*  ALKPHOS 270* 278* 266* 295* 282*  BILITOT 33.9* 31.8* 42.8* 44.6* 30.8*  PROT 6.7 6.1* 5.9* 6.1* 5.4*  ALBUMIN 2.0* 1.8* 1.7* 1.8* 1.6*   No results for input(s): LIPASE,  AMYLASE in the last 168 hours. No results for input(s): AMMONIA in the last 168 hours. Coagulation profile  Recent Labs Lab 06/25/15 0502 06/26/15 0453 06/27/15 0452 06/28/15 0430 06/29/15 0506  INR 1.59* 1.70* 1.74* 1.72* 1.78*    CBC:  Recent Labs Lab 06/28/15 0430  WBC 23.3*  HGB 13.0  HCT 36.2*  MCV 85.2  PLT 89*   Cardiac Enzymes: No results for input(s): CKTOTAL, CKMB, CKMBINDEX, TROPONINI in the last 168 hours. BNP (last 3 results) No results for input(s): PROBNP in the last 8760 hours. CBG:  Recent Labs Lab 06/28/15 0750 06/28/15 1124 06/28/15 1645 06/28/15 2124 06/29/15 0727  GLUCAP 77 94 93 100* 79   D-Dimer: No results for input(s): DDIMER in the last 72 hours. Hgb A1c: No results for input(s): HGBA1C in the last 72 hours. Lipid Profile: No results for input(s): CHOL, HDL, LDLCALC, TRIG, CHOLHDL, LDLDIRECT in the last 72 hours. Thyroid function studies: No results for input(s): TSH, T4TOTAL, T3FREE, THYROIDAB in the last 72 hours.  Invalid input(s): FREET3 Anemia work up: No results for input(s): VITAMINB12, FOLATE, FERRITIN, TIBC, IRON, RETICCTPCT in the last 72 hours. Sepsis Labs:  Recent Labs Lab 06/28/15 0430  WBC 23.3*   Microbiology No results found for this or any previous visit (from the past 240 hour(s)).   Medications:   . calcium carbonate  1 tablet Oral Once  . feeding supplement (ENSURE ENLIVE)  237 mL Oral BID BM  . folic acid  1 mg Oral Daily  . furosemide  40 mg Oral Daily  . lactulose  20 g Oral TID  . loratadine  10 mg Oral Daily  . multivitamin with minerals  1 tablet Oral Daily  . prednisoLONE  40 mg Oral Daily  . sodium chloride flush  3 mL Intravenous Q12H  . spironolactone  100 mg Oral Daily  . thiamine  100 mg Oral Daily   Continuous Infusions:   Time spent: 15 min   LOS: 12 days   Marinda ElkFELIZ ORTIZ, ABRAHAM  [facility] Pager [phone]  *Please refer to [email or web address], password [email or web address] to get updated  schedule on who will round on this patient, as hospitalists switch teams weekly. If 7PM-7AM, please contact night-coverage at [email or web address], password [email or web address] for any overnight needs.  06/29/2015, 9:45 AM

## 2015-06-29 NOTE — Progress Notes (Signed)
Subjective: No complaints.  Objective: Vital signs in last 24 hours: Temp:  [98.1 F (36.7 C)-98.3 F (36.8 C)] 98.3 F (36.8 C) (06/25 0522) Pulse Rate:  [90-96] 96 (06/25 0522) Resp:  [16-18] 16 (06/25 0522) BP: (133-148)/(86-92) 148/92 mmHg (06/25 0522) SpO2:  [100 %] 100 % (06/25 0522) Weight change:  Last BM Date: 06/27/15  PE: GEN:  Alert, no encephalopathy HEENT:  Scleral icterus ABD:  Mild-to-mod distention, non-tender  Lab Results: CBC    Component Value Date/Time   WBC 23.3* 06/28/2015 0430   WBC 3.3* 12/07/2012 1849   RBC 4.25 06/28/2015 0430   RBC 4.86 12/07/2012 1849   HGB 13.0 06/28/2015 0430   HGB 14.6 12/07/2012 1849   HCT 36.2* 06/28/2015 0430   HCT 46.6 12/07/2012 1849   PLT 89* 06/28/2015 0430   MCV 85.2 06/28/2015 0430   MCV 95.8 12/07/2012 1849   MCH 30.6 06/28/2015 0430   MCH 30.0 12/07/2012 1849   MCHC 35.9 06/28/2015 0430   MCHC 31.3* 12/07/2012 1849   RDW 21.7* 06/28/2015 0430   LYMPHSABS 0.4* 08/24/2013 2218   MONOABS 1.1* 08/24/2013 2218   EOSABS 0.0 08/24/2013 2218   BASOSABS 0.0 08/24/2013 2218   CMP     Component Value Date/Time   NA 133* 06/29/2015 0506   K 3.9 06/29/2015 0506   CL 104 06/29/2015 0506   CO2 20* 06/29/2015 0506   GLUCOSE 79 06/29/2015 0506   BUN 44* 06/29/2015 0506   CREATININE 1.66* 06/29/2015 0506   CREATININE 0.82 12/07/2012 1843   CALCIUM 7.6* 06/29/2015 0506   PROT 5.4* 06/29/2015 0506   ALBUMIN 1.6* 06/29/2015 0506   AST 179* 06/29/2015 0506   ALT 112* 06/29/2015 0506   ALKPHOS 282* 06/29/2015 0506   BILITOT 30.8* 06/29/2015 0506   GFRNONAA 45* 06/29/2015 0506   GFRAA 52* 06/29/2015 0506   INR 1.78  Assessment:   1. Alcoholic hepatitis, severe by lab parameters but clinically patient looks better than expected, can carry on coherent conversation and has no severe electrolyte or renal troubles and only mild-to-moderate coagulopathy. On prednisolone. 2. Elevated LFTs, from #1 above.  Bilirubin disproportionately elevated, but now downtrending. 3. Alcohol abuse. No signs of alcohol withdrawal.  Plan:  1.  Agree with decrease in diuretics, given increase in creatinine. 2.  Predisolone 40 mg po qd x 4 weeks total, then slow taper over two weeks (6 weeks total steroids). 3.  OK to discharge to [facility] from GI perspective, if OK with PT and if he meets medical eligibility criteria. 4.  Nothing else to do from GI perspective; will sign-off; please call with questions. 5.  Patient can follow-up with [facility] in 2-3 weeks [phone]).   Freddy JakschOUTLAW,Bettyjean Stefanski M 06/29/2015, 11:18 AM   Pager [phone] If no answer or after 5 PM call [phone]

## 2015-06-30 LAB — COMPREHENSIVE METABOLIC PANEL
ALT: 112 U/L — ABNORMAL HIGH (ref 17–63)
ALT: 115 U/L — ABNORMAL HIGH (ref 17–63)
ANION GAP: 9 (ref 5–15)
AST: 179 U/L — ABNORMAL HIGH (ref 15–41)
AST: 188 U/L — AB (ref 15–41)
Albumin: 1.6 g/dL — ABNORMAL LOW (ref 3.5–5.0)
Albumin: 1.6 g/dL — ABNORMAL LOW (ref 3.5–5.0)
Alkaline Phosphatase: 282 U/L — ABNORMAL HIGH (ref 38–126)
Alkaline Phosphatase: 286 U/L — ABNORMAL HIGH (ref 38–126)
Anion gap: 10 (ref 5–15)
BILIRUBIN TOTAL: 32.1 mg/dL — AB (ref 0.3–1.2)
BUN: 44 mg/dL — AB (ref 6–20)
BUN: 55 mg/dL — AB (ref 6–20)
CHLORIDE: 104 mmol/L (ref 101–111)
CO2: 20 mmol/L — AB (ref 22–32)
CO2: 17 mmol/L — ABNORMAL LOW (ref 22–32)
CREATININE: 2.2 mg/dL — AB (ref 0.61–1.24)
Calcium: 7.6 mg/dL — ABNORMAL LOW (ref 8.9–10.3)
Calcium: 7.5 mg/dL — ABNORMAL LOW (ref 8.9–10.3)
Chloride: 104 mmol/L (ref 101–111)
Creatinine, Ser: 1.66 mg/dL — ABNORMAL HIGH (ref 0.61–1.24)
GFR calc non Af Amer: 45 mL/min — ABNORMAL LOW (ref >60)
GFR, EST AFRICAN AMERICAN: 52 mL/min — AB (ref >60)
GFR, EST AFRICAN AMERICAN: 37 mL/min — AB (ref >60)
GFR, EST NON AFRICAN AMERICAN: 32 mL/min — AB (ref >60)
Glucose, Bld: 79 mg/dL (ref 65–99)
Glucose, Bld: 84 mg/dL (ref 65–99)
POTASSIUM: 3.3 mmol/L — AB (ref 3.5–5.1)
Potassium: 3.9 mmol/L (ref 3.5–5.1)
SODIUM: 133 mmol/L — AB (ref 135–145)
Sodium: 131 mmol/L — ABNORMAL LOW (ref 135–145)
TOTAL PROTEIN: 5.4 g/dL — AB (ref 6.5–8.1)
Total Bilirubin: 30.8 mg/dL (ref 0.3–1.2)
Total Protein: 5.4 g/dL — ABNORMAL LOW (ref 6.5–8.1)

## 2015-06-30 LAB — GLUCOSE, CAPILLARY
GLUCOSE-CAPILLARY: 89 mg/dL (ref 65–99)
GLUCOSE-CAPILLARY: 101 mg/dL — AB (ref 65–99)

## 2015-06-30 LAB — PROTIME-INR
INR: 1.92 — AB (ref 0.00–1.49)
PROTHROMBIN TIME: 21.9 s — AB (ref 11.6–15.2)

## 2015-06-30 MED ORDER — SPIRONOLACTONE 100 MG PO TABS: 50.0000 mg | ORAL_TABLET | Freq: Every day | ORAL | Status: DC

## 2015-06-30 MED ORDER — LACTULOSE 10 GM/15ML PO SOLN: 20.0000 g | Freq: Three times a day (TID) | ORAL | Status: AC

## 2015-06-30 MED ORDER — PREDNISOLONE 5 MG PO TABS: 40.0000 mg | ORAL_TABLET | Freq: Every day | ORAL | Status: AC

## 2015-06-30 MED ORDER — SPIRONOLACTONE 100 MG PO TABS: 100.0000 mg | ORAL_TABLET | Freq: Every day | ORAL | Status: DC

## 2015-06-30 MED ORDER — POTASSIUM CHLORIDE CRYS ER 20 MEQ PO TBCR
40.0000 meq | EXTENDED_RELEASE_TABLET | Freq: Two times a day (BID) | ORAL | Status: DC
  Administered 2015-06-30: 40 meq via ORAL
  Filled 2015-06-30: qty 2

## 2015-06-30 MED ORDER — SODIUM CHLORIDE 0.9 % IV BOLUS (SEPSIS)
500.0000 mL | Freq: Once | INTRAVENOUS | Status: AC
  Administered 2015-06-30: 500 mL via INTRAVENOUS

## 2015-06-30 MED ORDER — FOLIC ACID 1 MG PO TABS: 1.0000 mg | ORAL_TABLET | Freq: Every day | ORAL | Status: AC

## 2015-06-30 MED ORDER — SPIRONOLACTONE 100 MG PO TABS: 50.0000 mg | ORAL_TABLET | Freq: Every day | ORAL | Status: AC

## 2015-06-30 NOTE — Progress Notes (Signed)
CSW spoke with Chrissie NoaWilliam at [facility] who states that at this time they are unable to accept patient due to "medical acuity". CS[facility] - both [facility]. CSW provided patient's brother, Berna SpareMarcus with [facility] & encouraged him to call to make assessment appointment. Berna SpareMarcus is currently on his way up from [place] to pick up patient.   No further CSW needs identified - CSW signing off.   Lincoln MaxinKelly Weyman Bogdon, LCSW [facility] Clinical Social Worker cell #: [phone]

## 2015-06-30 NOTE — Progress Notes (Signed)
Physical Therapy Treatment Patient Details Name: Miguel BottomsChristopher Leh MRN: [id number] DOB: 02/08/1960 Today's Date: 06/30/2015    History of Present Illness Miguel BottomsChristopher Frees is a 55 y.o. male patient came to the emergency room with a complaint of weakness. He has been drinking daily a large amount of alcohol. He states that his last drink was the morning of 6/13. Patient drinks more than he eats. Admitted with alcoholic hepatitis, encephalopathy, alcohol intoxication    PT Comments    Min guard assist for ambulation with RW. Min assist for ambulation without RW/with IV pole -walked ~60 feet x2. Noted pt to maintain wide BOS with/without use of device. Pt c/o dizziness/lightheadedness as well. Continue to recommend RW use for ambulation. Could consider OP PT for balance training if instability persists.    Follow Up Recommendations  Close supervision for OOB/mobility     Equipment Recommendations  Rolling walker with 5" wheels    Recommendations for Other Services       Precautions / Restrictions Precautions Precautions: Fall Restrictions Weight Bearing Restrictions: No    Mobility  Bed Mobility Overal bed mobility: Needs Assistance Bed Mobility: Supine to Sit;Sit to Supine     Supine to sit: Modified independent (Device/Increase time) Sit to supine: Modified independent (Device/Increase time)      Transfers Overall transfer level: Needs assistance Equipment used: Rolling walker (2 wheeled) Transfers: Sit to/from Stand Sit to Stand: Supervision         General transfer comment: for safety. pt is still unsteady.   Ambulation/Gait Ambulation/Gait assistance: Min guard;Min assist Ambulation Distance (Feet): 250 Feetx1; 60'x2 Assistive device: Rolling walker (2 wheeled) Gait Pattern/deviations: Wide base of support;Decreased stride length     General Gait Details: Min guard with use of RW. Min assist without an assistive device. Unsteady.    Stairs             Wheelchair Mobility    Modified Rankin (Stroke Patients Only)       Balance           Standing balance support: During functional activity Standing balance-Leahy Scale: Poor Had pt perform: static standing-EO/EC, narrow BOS, withstanding of perturbations-pt unable to achieve narrow BOS without holding on to surface. Pick up object: Min guard assist. 360 degree turn to L and R: Increased time and Min guard assist.                       Cognition Arousal/Alertness: Awake/alert Behavior During Therapy: WFL for tasks assessed/performed Overall Cognitive Status: Within Functional Limits for tasks assessed                      Exercises      General Comments        Pertinent Vitals/Pain Pain Assessment: No/denies pain    Home Living                      Prior Function            PT Goals (current goals can now be found in the care plan section) Progress towards PT goals: Progressing toward goals    Frequency  Min 3X/week    PT Plan Current plan remains appropriate    Co-evaluation             End of Session Equipment Utilized During Treatment: Gait belt Activity Tolerance: Patient tolerated treatment well Patient left: in bed;with call bell/phone within reach;with bed alarm set  Time: [id number] PT Time Calculation (min) (ACUTE ONLY): 15 min  Charges:  $Gait Training: 8-22 mins                    G Codes:      Rebeca AlertJannie Avaiyah Strubel, MPT Pager: [phone]

## 2015-06-30 NOTE — Progress Notes (Signed)
Patient discharged home with brother/sister, discharge instruction/prescription given and explained to patient/brother and they verbalized understanding, denies any pain/distress. Accompanied home by brother. No wound noted, skin intact. F/U with home health.

## 2015-06-30 NOTE — Progress Notes (Signed)
Pt discharging home with [facility] and an [facility].

## 2015-06-30 NOTE — Discharge Summary (Signed)
Physician Discharge Summary  Miguel Washington HYQ:[id number] DOB: 1960-03-16 DOA: 06/17/2015  PCP: No PCP Per Patient  Admit date: 06/17/2015 Discharge date: 06/30/2015  Time spent: 35 minutes  Recommendations for Outpatient Follow-up:  1. [facility]. 2. We'll follow-up with Gastroenterology in 2-4 weeks we'll need to check basic metabolic panel.   Discharge Diagnoses:  Principal Problem:   Alcoholic liver disease (HCC) Active Problems:   HYPERTENSION, BENIGN ESSENTIAL   Hyponatremia   Acute alcohol intoxication (HCC)   Hypokalemia   Abnormal LFTs   Hyperbilirubinemia   Prolonged Q-T interval on ECG   Hyperammonemia (HCC)   Malnutrition of moderate degree   Discharge Condition: guarded  Diet recommendation: low sodium  Filed Weights   06/17/15 0547  Weight: 69.491 kg (153 lb 3.2 oz)    History of present illness:  55 year old male with past medical history of alcohol abuse comes into the hospital as he was feeling weak and unable to i.e.  Hospital Course:  Alcoholic hepatitis/alcohol liver disease: He was admitted to the hospital start her on IV steroids GI was consulted, his PT and INR was elevated his total bilirubin was 21, his AST and ALT were elevated and alcoholic pattern. Maddrey's hepatic discriminant function at 63.7 GI recommended to continue steroids orally for 4-6 weeks abdominal ultrasound revealed mild ascites he was started on spironolactone and Lasix. His creatinine started trending up he was given IV fluid diuretics were held to 2 days and he will resume his spironolactone at 50 minutes daily. Patient once he was more awake he expressed that he didn't want to quit drinking social worker was consulted and the patient got [facility].  Alcohol intoxication: Patient had been trying to detox for the last 3 days there were no evidence of withdrawal throughout his hospital stay   Acute encephalopathy: Suspect  secondary to hepatic encephalopathy his ammonia level was 124, he was started on lactulose is which she will continue as an outpatient. Does resolve throughout his hospital stay.  Electrolytes imbalance: He was started on spironolactone was stabilizis hypokalemia.  Essential hypertension: We'll continue diuretic therapy seems to be controlled.   Procedures:  Abdominal ultrasound  Consultations:  Gastroenterology  Discharge Exam: Filed Vitals:   06/29/15 1455 06/30/15 0515  BP: 148/86 128/77  Pulse: 90 95  Temp: 98.9 F (37.2 C) 98.1 F (36.7 C)  Resp: 20 20    General: Awake alert and oriented 3 Cardiovascular: Regular rate and rhythm Respiratory: Good air movement clear to auscultation  Discharge Instructions   Discharge Instructions    Diet - low sodium heart healthy    Complete by:  As directed      Increase activity slowly    Complete by:  As directed           Current Discharge Medication List    START taking these medications   Details  folic acid (FOLVITE) 1 MG tablet Take 1 tablet (1 mg total) by mouth daily. Qty: 30 tablet, Refills: 0    lactulose (CHRONULAC) 10 GM/15ML solution Take 30 mLs (20 g total) by mouth 3 (three) times daily. Qty: 240 mL, Refills: 0    prednisoLONE 5 MG TABS tablet Take 8 tablets (40 mg total) by mouth daily. Qty: 90 each, Refills: 0    spironolactone (ALDACTONE) 100 MG tablet Take 0.5 tablets (50 mg total) by mouth daily. Qty: 30 tablet, Refills: 0      CONTINUE these medications which have NOT CHANGED  Details  cetirizine (ZYRTEC) 10 MG tablet Take 1 tablet (10 mg total) by mouth daily. Qty: 30 tablet, Refills: 11    metoprolol (LOPRESSOR) 100 MG tablet Take 1 tablet (100 mg total) by mouth 2 (two) times daily. Qty: 180 tablet, Refills: 1   Associated Diagnoses: Essential hypertension      STOP taking these medications     amLODipine (NORVASC) 2.5 MG tablet      azithromycin (ZITHROMAX) 250 MG tablet       benzonatate (TESSALON) 100 MG capsule        No Known Allergies Follow-up Information    Follow up with [facility] On 06/25/2015.   Why:  appointment at 2:30 PM   Contact information:   [address] [place] [phone]        The results of significant diagnostics from this hospitalization (including imaging, microbiology, ancillary and laboratory) are listed below for reference.    Significant Diagnostic Studies: [place] Abdomen Complete  06/20/2015  CLINICAL DATA:  Cirrhosis, elevated LFTs EXAM: ABDOMEN ULTRASOUND COMPLETE COMPARISON:  06/17/2015 FINDINGS: Gallbladder: Gallbladder is surgically absent Common bile duct: Diameter: 2 mm in diameter within normal limits. Liver: No focal hepatic mass is noted. Again noted increased echogenicity of the liver suspicious for fatty infiltration or chronic liver disease. Small perihepatic ascites. Again noted the reversal of flow in portal vein consistent with portal hypertension IVC: No abnormality visualized. Pancreas: Visualized portion unremarkable. Spleen: Size and appearance within normal limits. Measures 5.7 cm in length Right Kidney: Length: 11.6 cm. Echogenicity within normal limits. No mass or hydronephrosis visualized. Left Kidney: Length: 11 cm. Echogenicity within normal limits. No mass or hydronephrosis visualized. Abdominal aorta: No aneurysm visualized. Other findings: Abdominal ascites noted bilateral lower quadrant. IMPRESSION: 1. Surgical absent gallbladder.  Normal CBD. 2. Again noted diffuse increased echogenicity of the liver suspicious for fatty infiltration or chronic parenchymal liver disease. Again noted reversal of flow portal vein consistent with portal hypertension. 3. No focal hepatic mass. 4. No hydronephrosis. 5. Abdominal ascites is noted. Electronically Signed   By: Natasha MeadLiviu  Pop M.D.   On: 06/20/2015 11:42   Dg Abd Acute W/chest  06/17/2015  CLINICAL DATA:  Jaundice. Upper abdominal  pain and nausea for 1 week. Alcohol use. EXAM: DG ABDOMEN ACUTE W/ 1V CHEST COMPARISON:  Chest 01/07/2009.  CT abdomen and pelvis 06/03/2003. FINDINGS: Normal heart size and pulmonary vascularity. Blunting of the right costophrenic angle suggests a small effusion. No focal airspace disease or consolidation in the lungs. No pneumothorax. Mediastinal contours appear intact. Scattered gas and stool throughout the colon. No small or large bowel distention. No free intra-abdominal air. No abnormal air-fluid levels. Surgical clips in the right upper quadrant. Calcified phleboliths in the pelvis. No radiopaque stones. Visualized bones appear intact. IMPRESSION: Probable small right pleural effusion. Normal nonobstructive bowel gas pattern. Electronically Signed   By: Burman NievesWilliam  Stevens M.D.   On: 06/17/2015 02:50   [place] Abdomen Limited Ruq  06/17/2015  CLINICAL DATA:  Elevated liver enzymes EXAM: US ABDOMEN LIMITED - RIGHT UPPER QUADRANT COMPARISON:  August 15, 2013 FINDINGS: Gallbladder: Surgically absent. Common bile duct: Diameter: 3 mm. No intrahepatic or extrahepatic biliary duct dilatation. Liver: No focal lesion identified. The liver echogenicity overall is increased. Flow in the portal vein is hepatofugal, opposite of normal anatomic direction. A small amount of ascites is noted around the liver. IMPRESSION: Evidence of portal venous hypertension with reversal of flow in the portal vein. The  liver has an appearance consistent with hepatic steatosis and likely underlying parenchymal liver disease. While no focal liver lesions are identified, the sensitivity of ultrasound for focal liver lesions is diminished significantly in this circumstance. A small amount of ascites surrounding the liver is noted. Gallbladder is absent. No biliary duct dilatation evident. Electronically Signed   By: Bretta Bang III M.D.   On: 06/17/2015 07:24    Microbiology: No results found for this or any previous visit (from the past  240 hour(s)).   Labs: Basic Metabolic Panel:  Recent Labs Lab 06/26/15 0453 06/27/15 0452 06/28/15 0430 06/29/15 0506 06/30/15 0426  NA 136 132* 134* 133* 131*  K 3.6 3.3* 3.0* 3.9 3.3*  CL 107 102 102 104 104  CO2 20* 17*  GLUCOSE 87 106* 87 79 84  BUN 31* 35* 39* 44* 55*  CREATININE 0.82 0.97 1.06 1.66* 2.20*  CALCIUM 8.3* 8.0* 8.0* 7.6* 7.5*   Liver Function Tests:  Recent Labs Lab 06/26/15 0453 06/27/15 0452 06/28/15 0430 06/29/15 0506 06/30/15 0426  AST 160* 163* 184* 179* 188*  ALT 79* 90* 105* 112* 115*  ALKPHOS 278* 266* 295* 282* 286*  BILITOT 31.8* 42.8* 44.6* 30.8* 32.1*  PROT 6.1* 5.9* 6.1* 5.4* 5.4*  ALBUMIN 1.8* 1.7* 1.8* 1.6* 1.6*   No results for input(s): LIPASE, AMYLASE in the last 168 hours. No results for input(s): AMMONIA in the last 168 hours. CBC:  Recent Labs Lab 06/28/15 0430  WBC 23.3*  HGB 13.0  HCT 36.2*  MCV 85.2  PLT 89*   Cardiac Enzymes: No results for input(s): CKTOTAL, CKMB, CKMBINDEX, TROPONINI in the last 168 hours. BNP: BNP (last 3 results) No results for input(s): BNP in the last 8760 hours.  ProBNP (last 3 results) No results for input(s): PROBNP in the last 8760 hours.  CBG:  Recent Labs Lab 06/29/15 0727 06/29/15 1142 06/29/15 1629 06/29/15 2111 06/30/15 0746  GLUCAP 79 85 87 101* 89       Signed:  Marinda Elk MD.  [facility] 06/30/2015, 9:47 AM

## 2015-07-25 ENCOUNTER — Ambulatory Visit: Payer: Self-pay | Admitting: Family Medicine

## 2015-09-04 ENCOUNTER — Other Ambulatory Visit: Payer: Self-pay | Admitting: Urgent Care

## 2015-09-04 DIAGNOSIS — I10 Essential (primary) hypertension: Secondary | ICD-10-CM

## 2015-09-05 DEATH — deceased

## 2016-11-19 IMAGING — US US ABDOMEN LIMITED
1 series · 14 of 25 positions shown · non-contrast
Comparison: August 15, 2013

CLINICAL DATA: Elevated liver enzymes

EXAM:
US ABDOMEN LIMITED - RIGHT UPPER QUADRANT

[Series 1: us abdomen limited · 0.22mm/px · 14 of 43 slices shown]
[im 1/43]
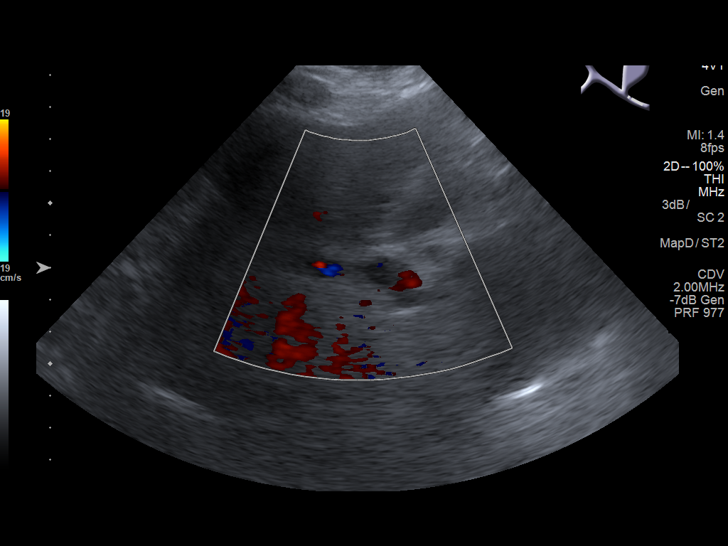
[im 4/43]
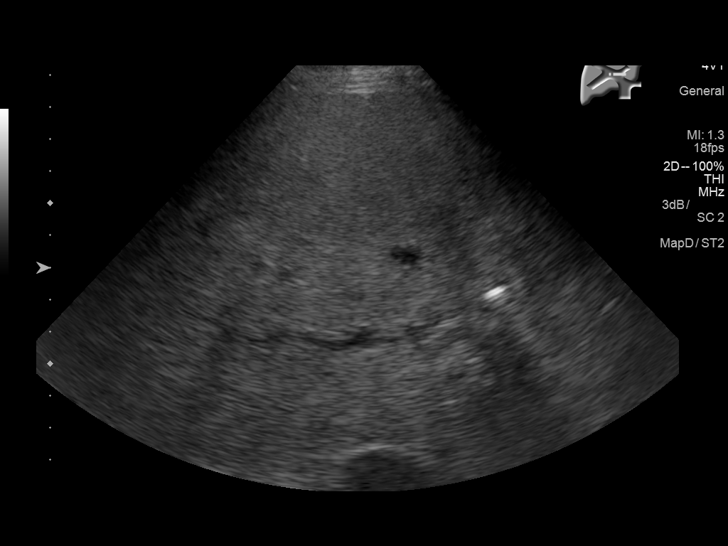
[im 8/43]
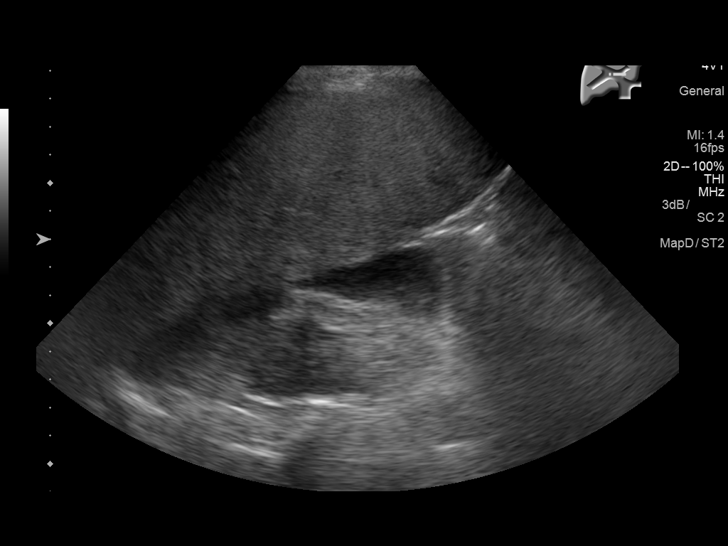
[im 11/43]
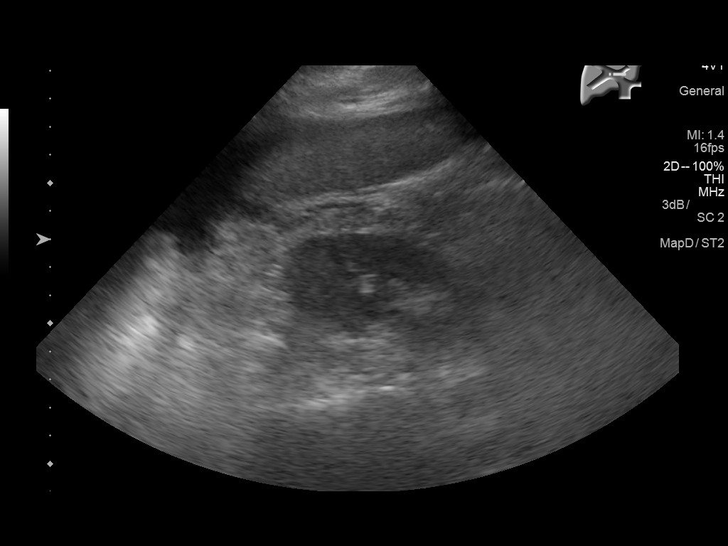
[im 15/43]
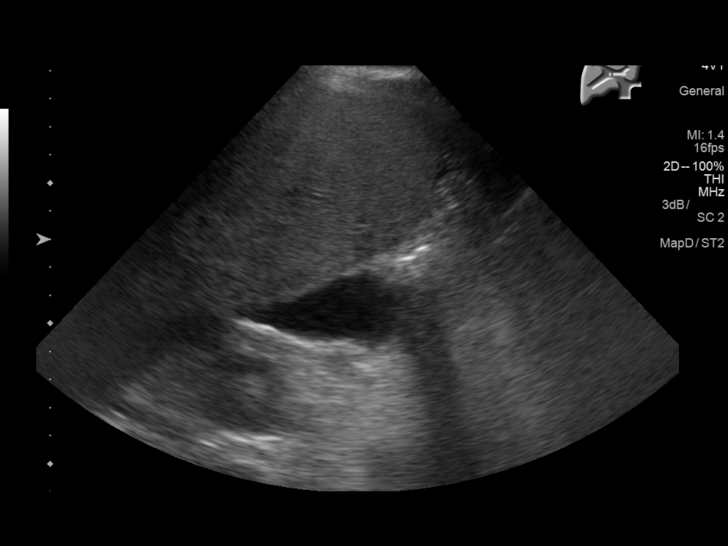
[im 16/43]
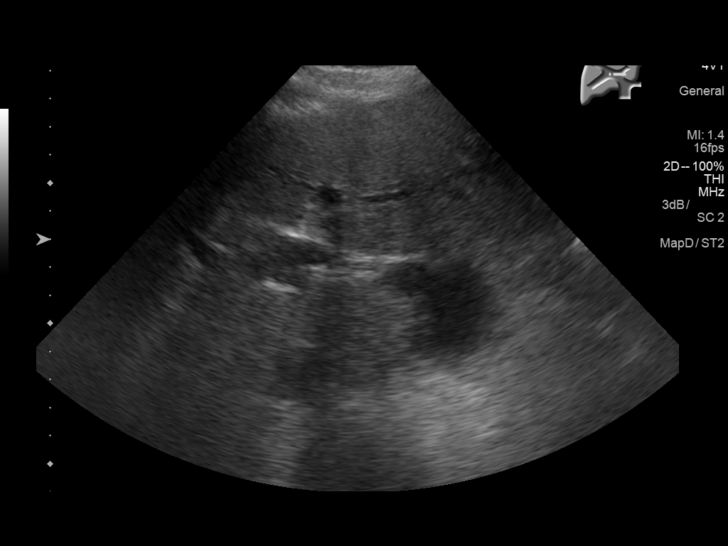
[im 20/43]
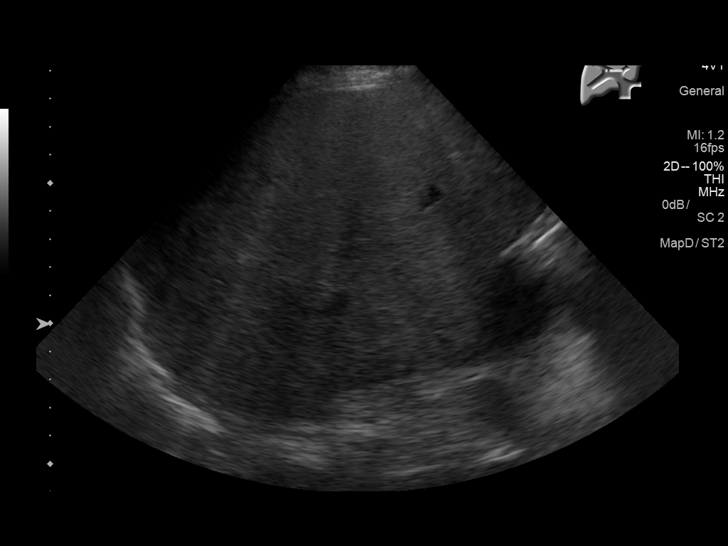
[im 23/43]
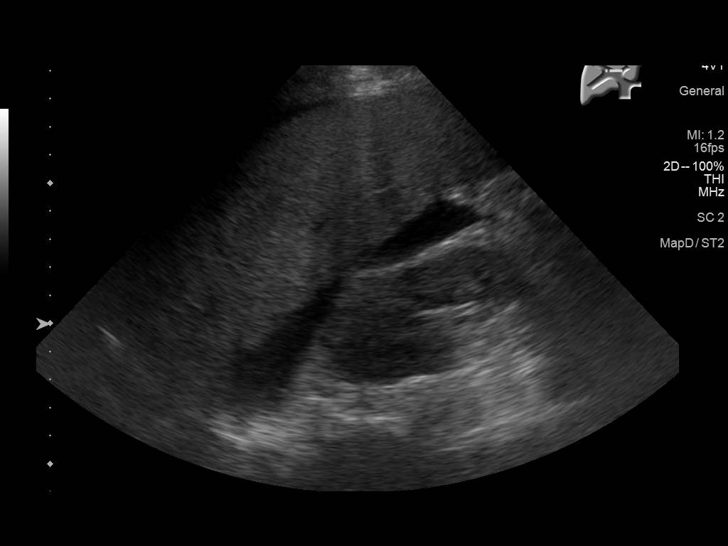
[im 27/43]
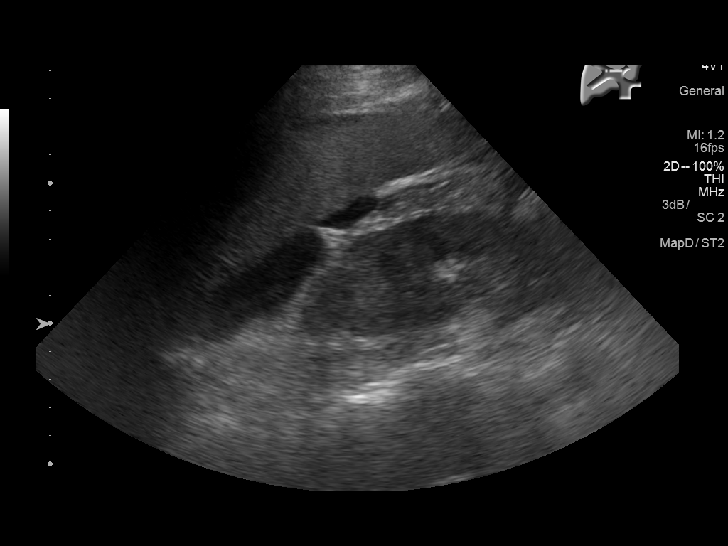
[im 29/43]
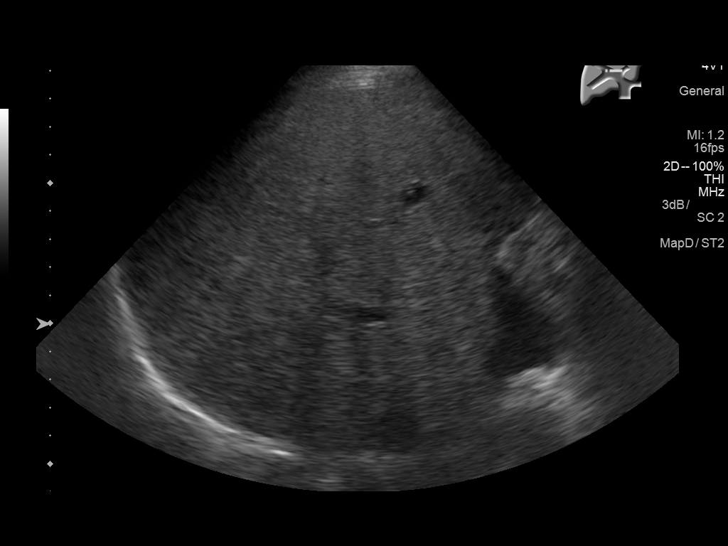
[im 32/43]
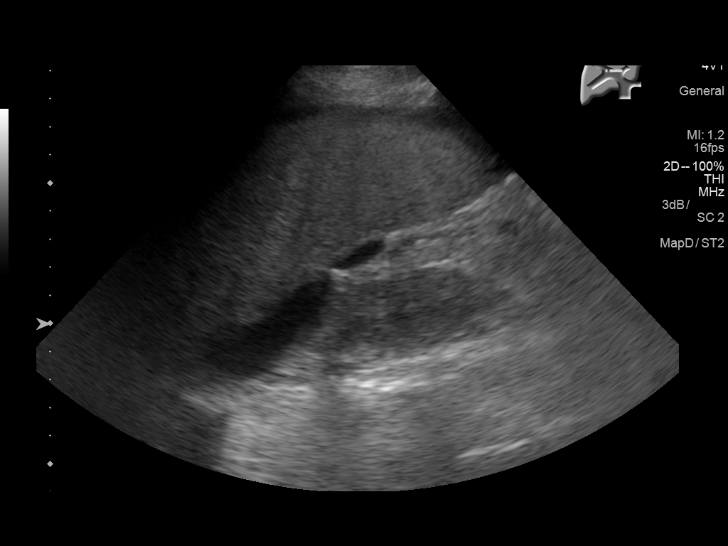
[im 36/43]
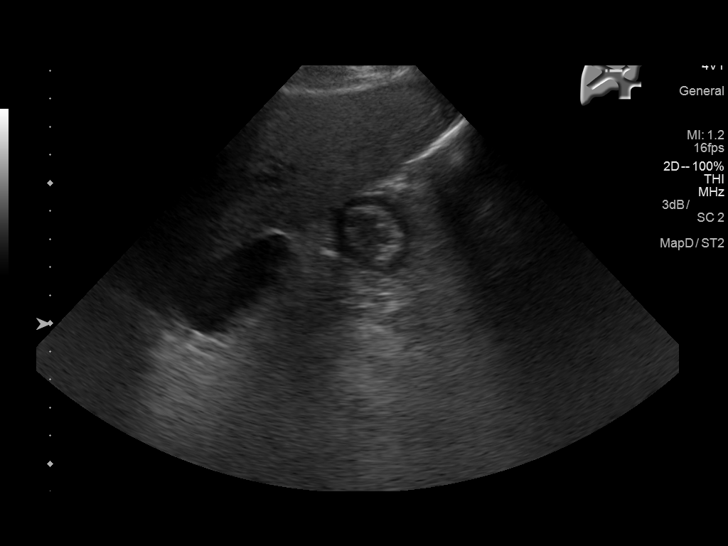
[im 39/43]
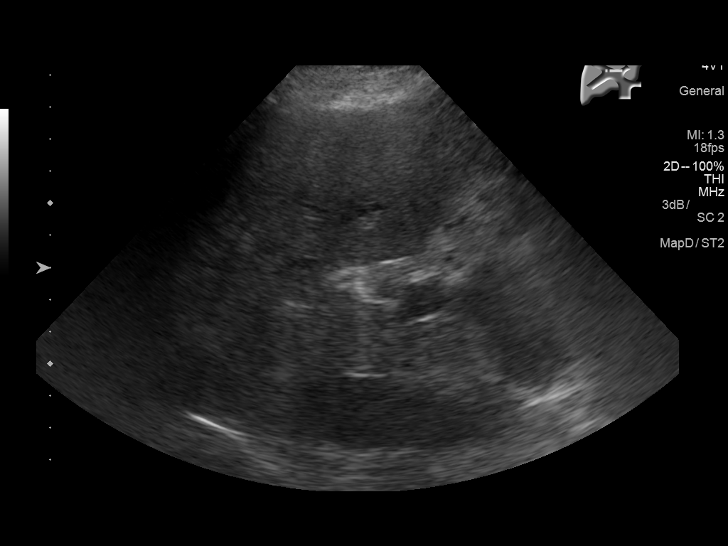
[im 43/43]
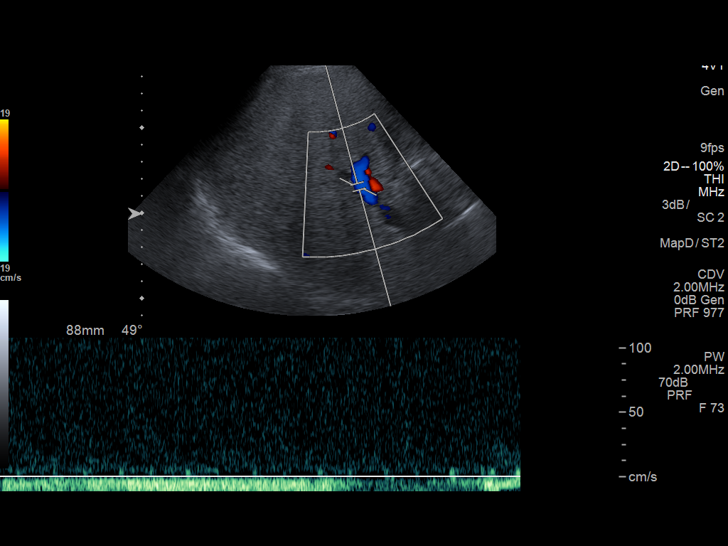

[14 of 25 positions shown; findings below may reference images not displayed]

FINDINGS: Gallbladder:

Surgically absent.

Common bile duct:

Diameter: 3 mm. No intrahepatic or extrahepatic biliary duct
dilatation.

Liver:

No focal lesion identified. The liver echogenicity overall is
increased. Flow in the portal vein is hepatofugal, opposite of
normal anatomic direction.

A small amount of ascites is noted around the liver.
IMPRESSION: Evidence of portal venous hypertension with reversal of flow in the
portal vein. The liver has an appearance consistent with hepatic
steatosis and likely underlying parenchymal liver disease. While no
focal liver lesions are identified, the sensitivity of ultrasound
for focal liver lesions is diminished significantly in this
circumstance. A small amount of ascites surrounding the liver is
noted. Gallbladder is absent. No biliary duct dilatation evident.

## 2016-11-20 IMAGING — CR DG ABDOMEN ACUTE W/ 1V CHEST
4 series · 4 of 4 positions shown · non-contrast
Comparison: Chest 01/07/2009.  CT abdomen and pelvis 06/03/2003.

CLINICAL DATA: Jaundice. Upper abdominal pain and nausea for 1
week. Alcohol use.

EXAM:
DG ABDOMEN ACUTE W/ 1V CHEST

[w chest pa]
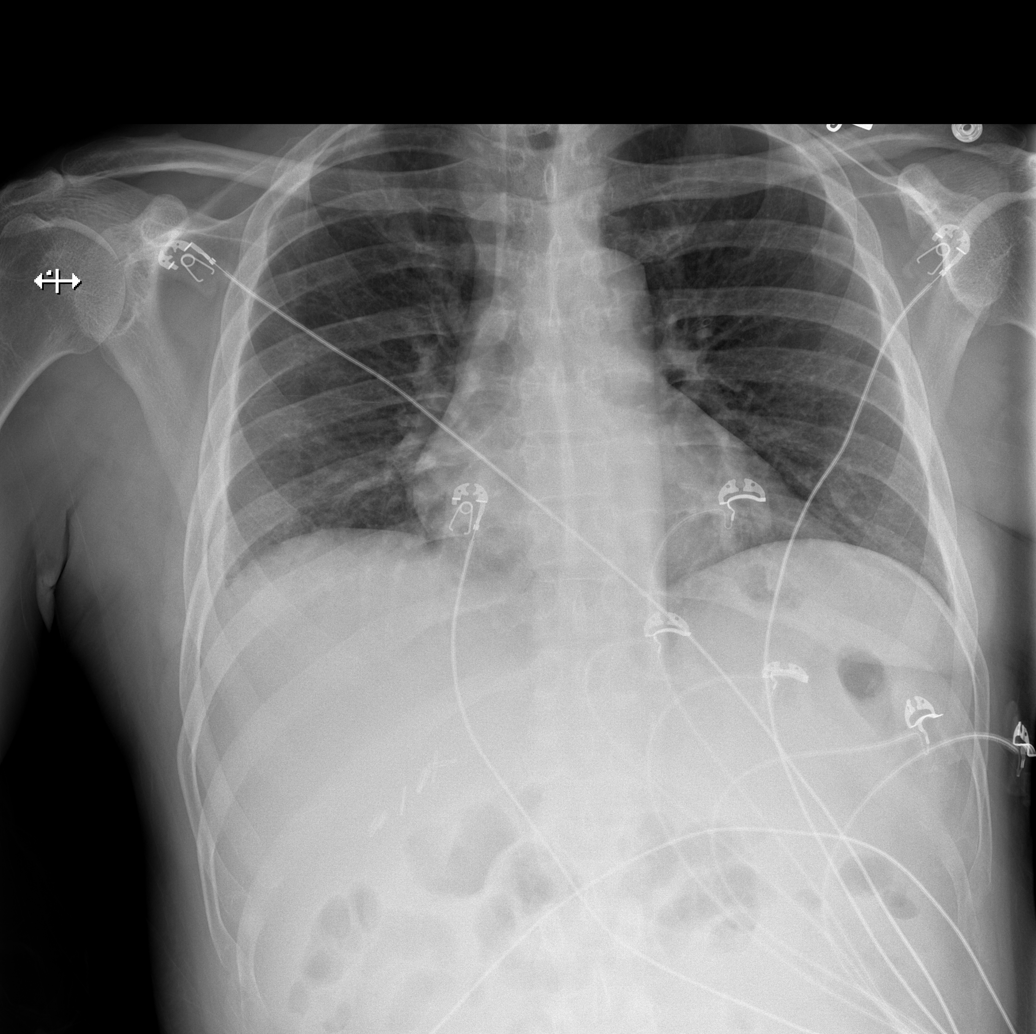

[w abdomen decub (1 of 2)]
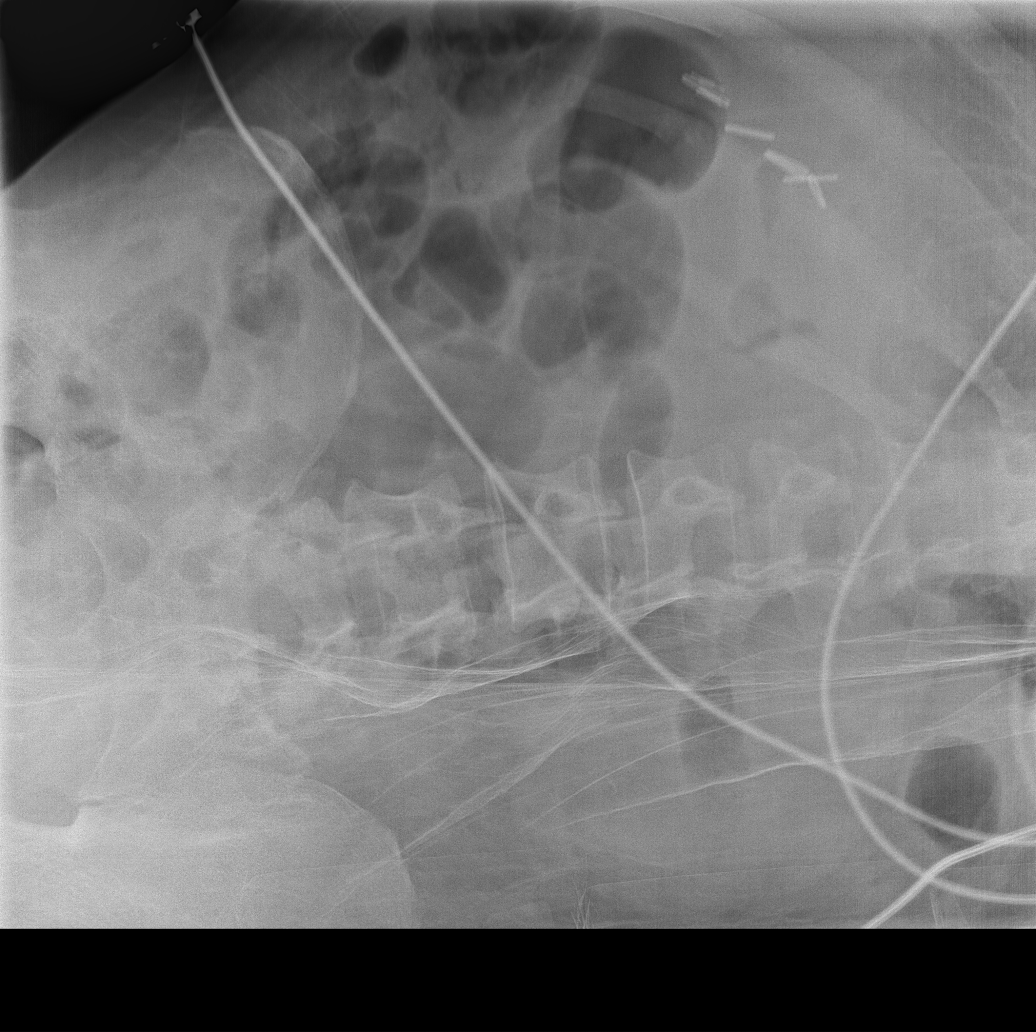

[w abdomen decub (2 of 2)]
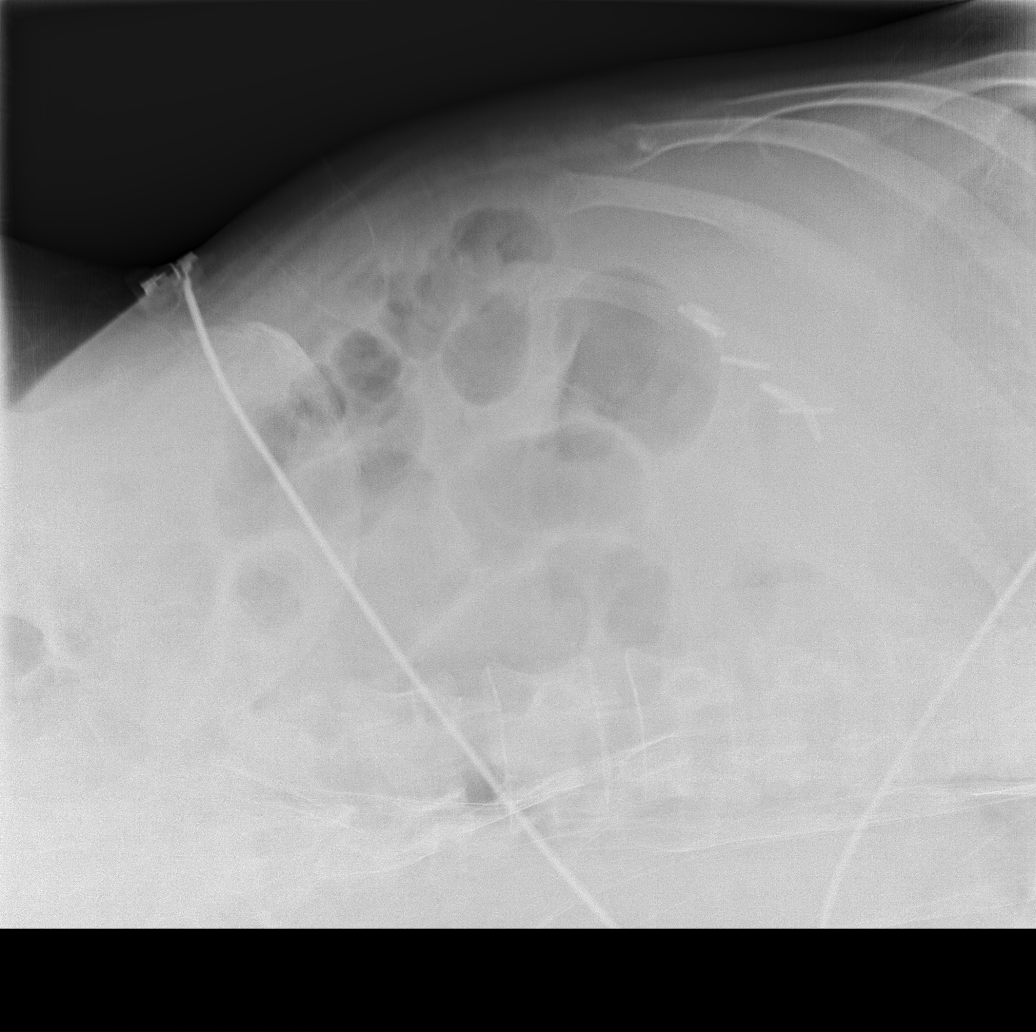

[x abdomen supine]
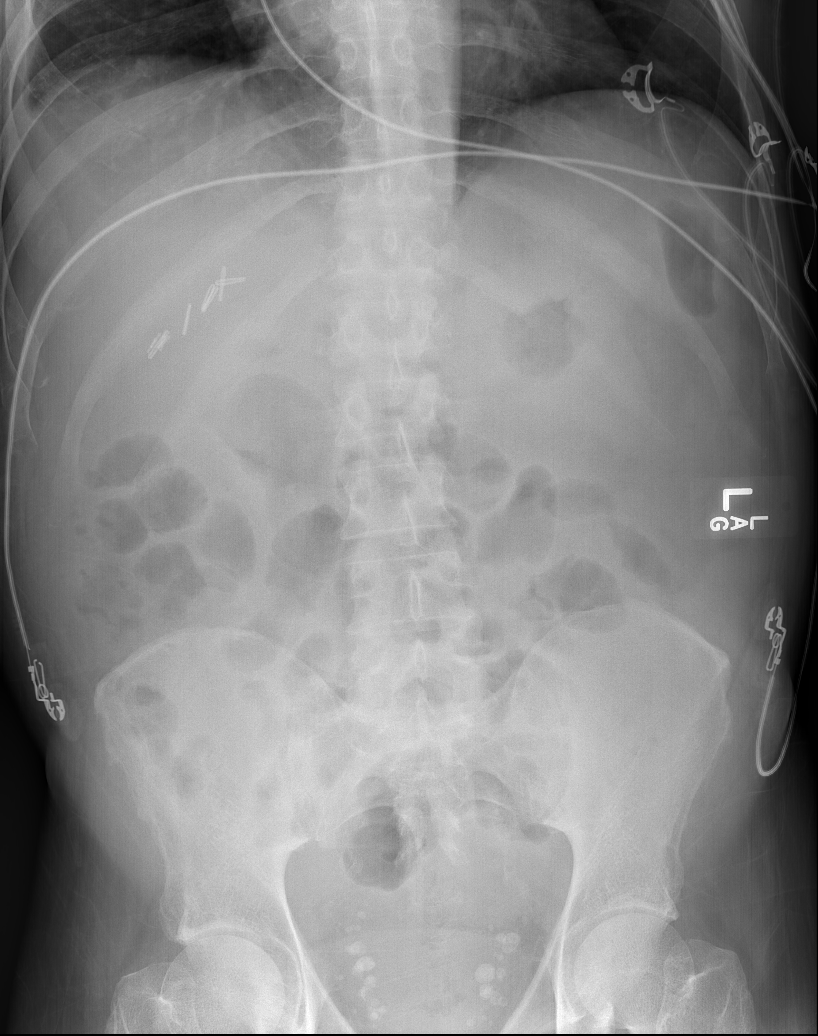

[4 of 4 positions shown; findings below may reference images not displayed]

FINDINGS: Normal heart size and pulmonary vascularity. Blunting of the right
costophrenic angle suggests a small effusion. No focal airspace
disease or consolidation in the lungs. No pneumothorax. Mediastinal
contours appear intact.

Scattered gas and stool throughout the colon. No small or large
bowel distention. No free intra-abdominal air. No abnormal air-fluid
levels. Surgical clips in the right upper quadrant. Calcified
phleboliths in the pelvis. No radiopaque stones. Visualized bones
appear intact.
IMPRESSION: Probable small right pleural effusion. Normal nonobstructive bowel
gas pattern.

## 2016-11-22 IMAGING — US US ABDOMEN COMPLETE
1 series · 13 of 25 positions shown · non-contrast
Comparison: 06/17/2015

CLINICAL DATA: Cirrhosis, elevated LFTs

EXAM:
ABDOMEN ULTRASOUND COMPLETE

[Series 1: us abdomen complete · 0.19mm/px · 13 of 76 slices shown]
[im 1/76]
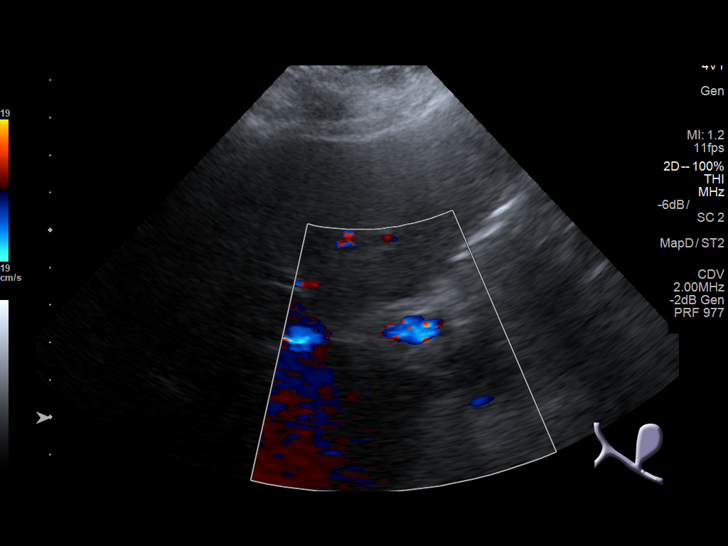
[im 7/76]
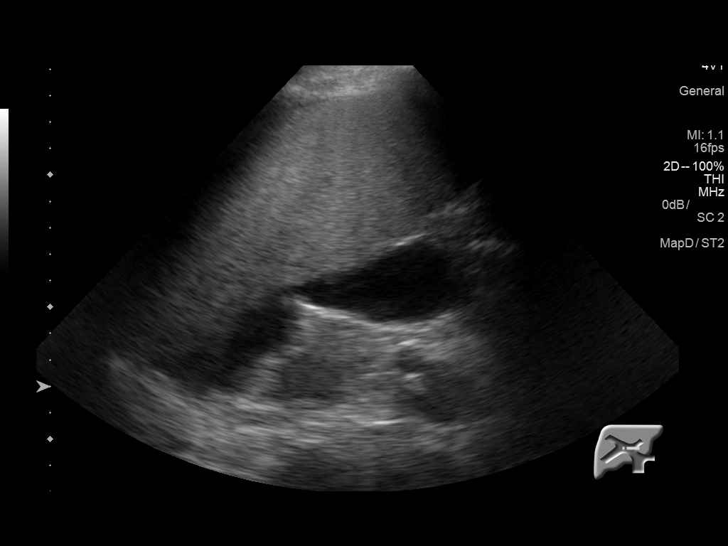
[im 13/76]
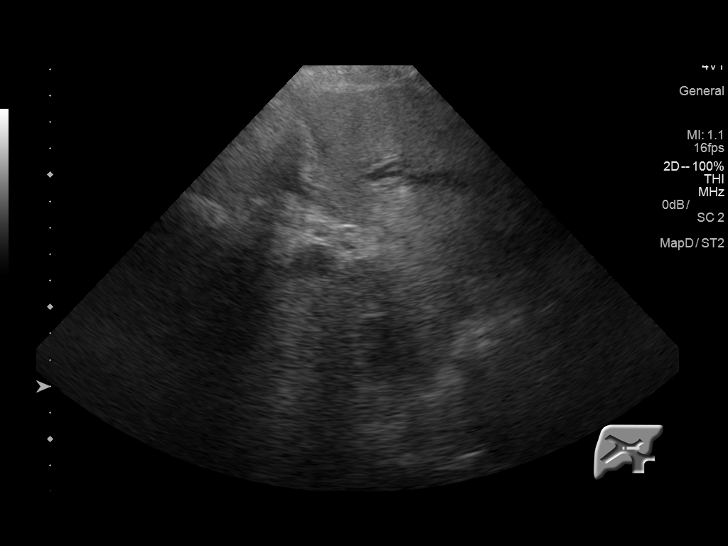
[im 19/76]
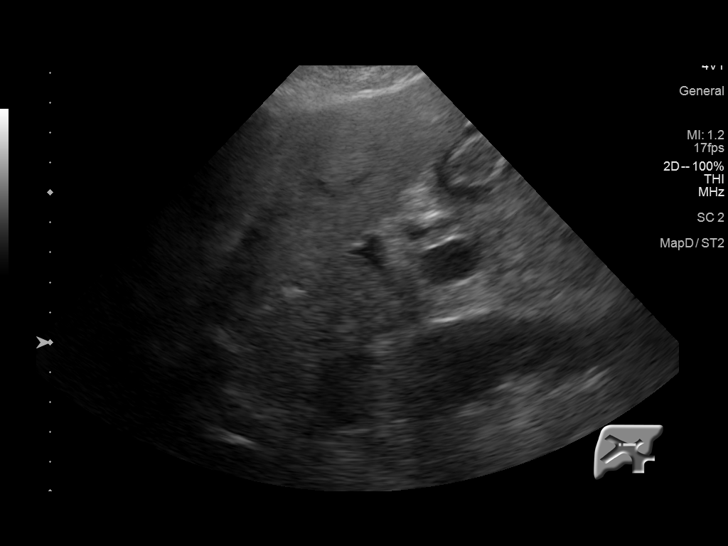
[im 26/76]
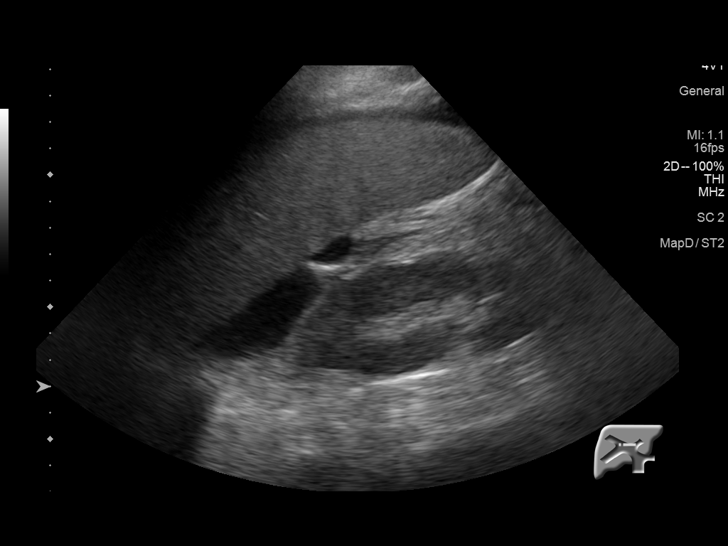
[im 32/76]
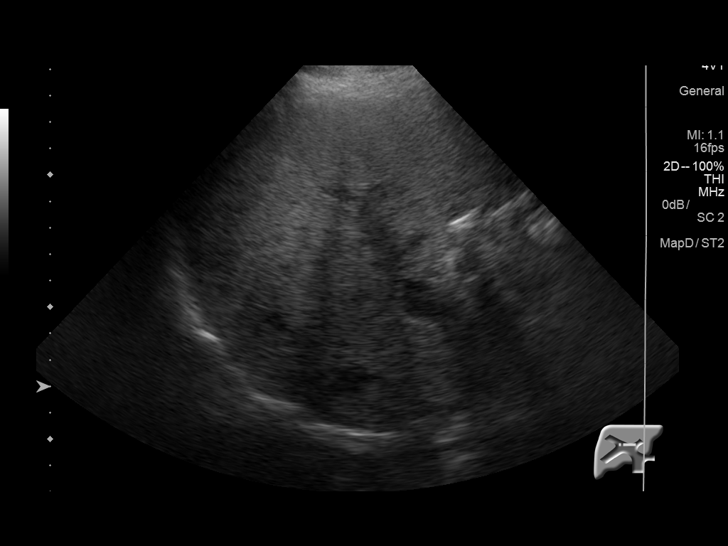
[im 38/76]
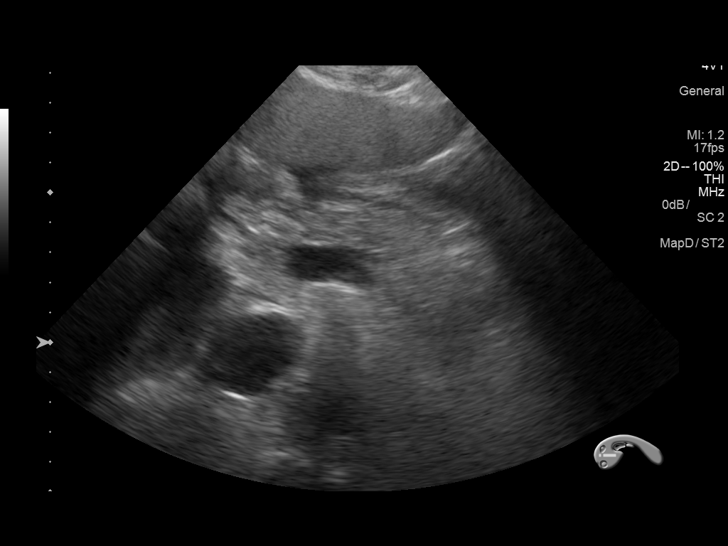
[im 44/76]
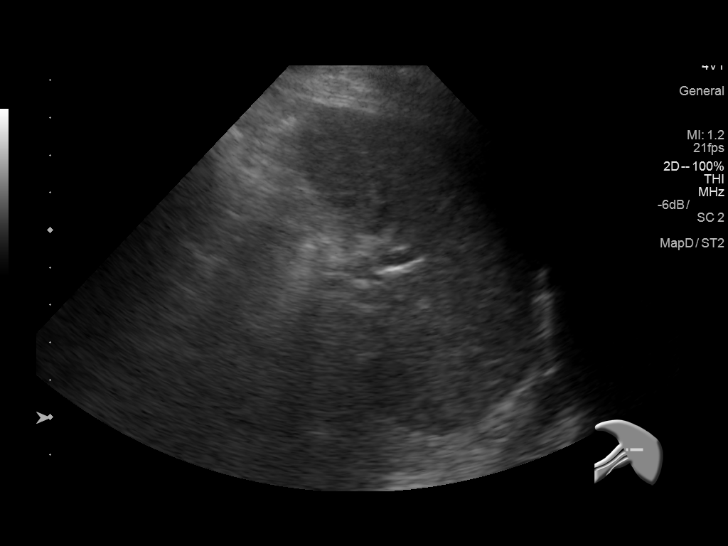
[im 51/76]
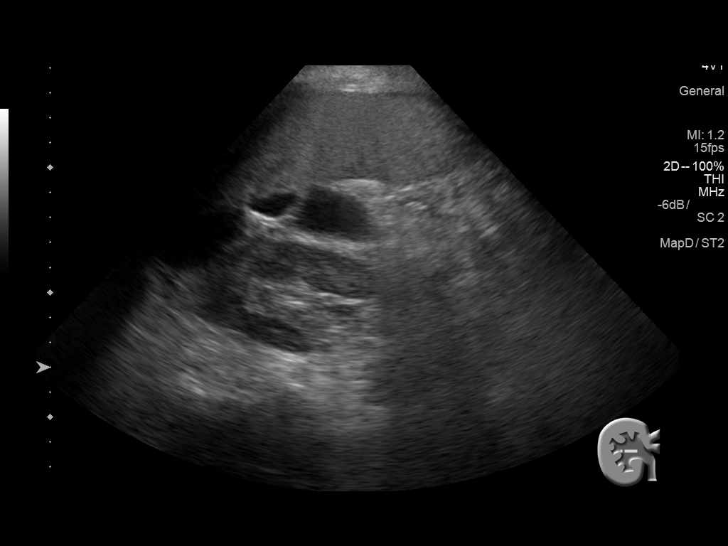
[im 57/76]
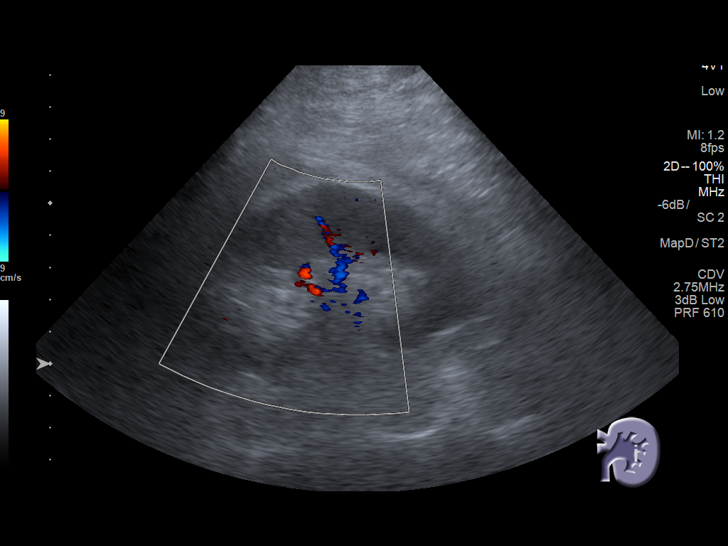
[im 63/76]
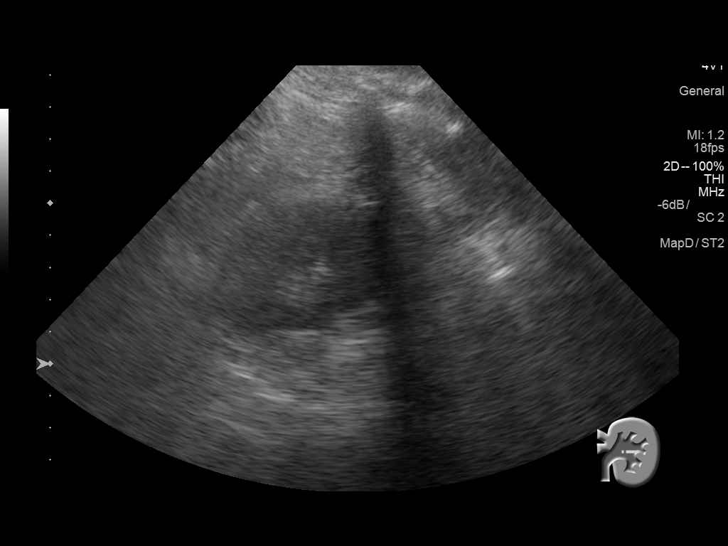
[im 69/76]
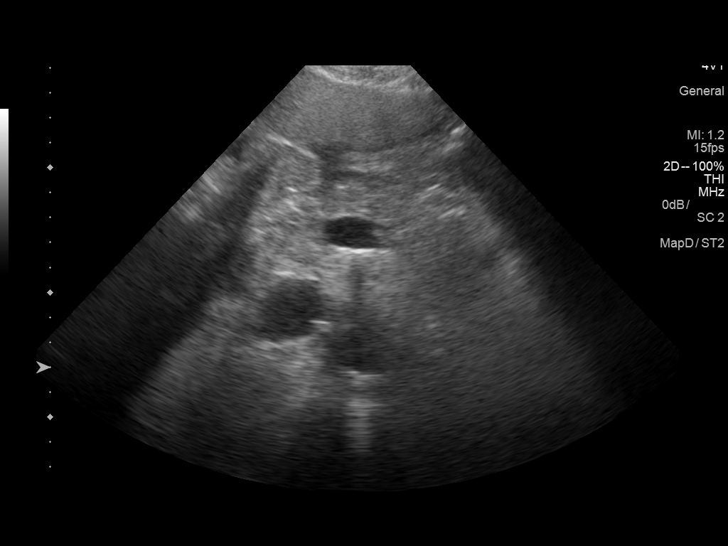
[im 76/76]
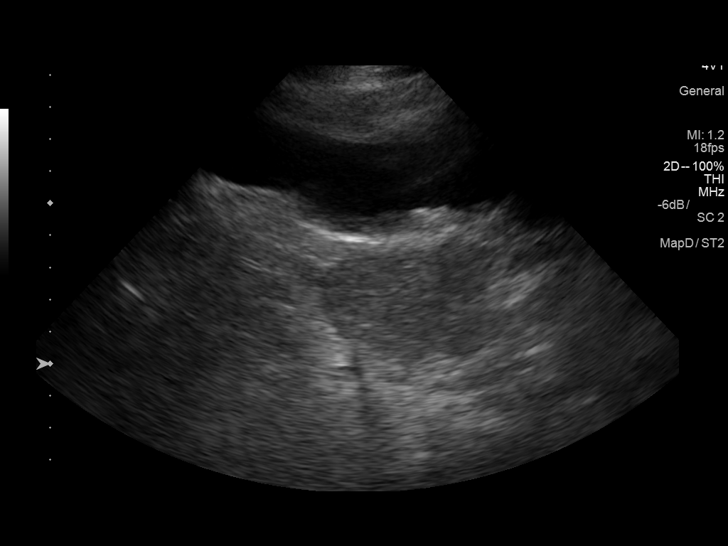

[13 of 25 positions shown; findings below may reference images not displayed]

FINDINGS: Gallbladder: Gallbladder is surgically absent

Common bile duct: Diameter: 2 mm in diameter within normal limits.

Liver: No focal hepatic mass is noted. Again noted increased
echogenicity of the liver suspicious for fatty infiltration or
chronic liver disease. Small perihepatic ascites. Again noted the
reversal of flow in portal vein consistent with portal hypertension

IVC: No abnormality visualized.

Pancreas: Visualized portion unremarkable.

Spleen: Size and appearance within normal limits. Measures 5.7 cm in
length

Right Kidney: Length: 11.6 cm. Echogenicity within normal limits. No
mass or hydronephrosis visualized.

Left Kidney: Length: 11 cm. Echogenicity within normal limits. No
mass or hydronephrosis visualized.

Abdominal aorta: No aneurysm visualized.

Other findings: Abdominal ascites noted bilateral lower quadrant.
IMPRESSION: 1. Surgical absent gallbladder.  Normal CBD.
2. Again noted diffuse increased echogenicity of the liver
suspicious for fatty infiltration or chronic parenchymal liver
disease. Again noted reversal of flow portal vein consistent with
portal hypertension.
3. No focal hepatic mass.
4. No hydronephrosis.
5. Abdominal ascites is noted.
